# Patient Record
Sex: Male | Born: 1940
Health system: Southern US, Community
[De-identification: ages and names within clinical notes are randomized; demographics above are authoritative.]

## PROBLEM LIST (undated history)

## (undated) DIAGNOSIS — H9190 Unspecified hearing loss, unspecified ear: Secondary | ICD-10-CM

## (undated) DIAGNOSIS — I10 Essential (primary) hypertension: Secondary | ICD-10-CM

## (undated) DIAGNOSIS — Z923 Personal history of irradiation: Secondary | ICD-10-CM

## (undated) DIAGNOSIS — C61 Malignant neoplasm of prostate: Secondary | ICD-10-CM

## (undated) DIAGNOSIS — R519 Headache, unspecified: Secondary | ICD-10-CM

## (undated) DIAGNOSIS — M1990 Unspecified osteoarthritis, unspecified site: Secondary | ICD-10-CM

## (undated) DIAGNOSIS — T7840XA Allergy, unspecified, initial encounter: Secondary | ICD-10-CM

## (undated) DIAGNOSIS — E876 Hypokalemia: Secondary | ICD-10-CM

## (undated) HISTORY — PX: COLONOSCOPY: SHX174

## (undated) HISTORY — PX: OTHER SURGICAL HISTORY: SHX169

## (undated) HISTORY — DX: Headache, unspecified: R51.9

## (undated) HISTORY — DX: Essential (primary) hypertension: I10

## (undated) HISTORY — DX: Personal history of irradiation: Z92.3

---

## 2001-04-19 DIAGNOSIS — C61 Malignant neoplasm of prostate: Secondary | ICD-10-CM

## 2001-04-19 HISTORY — DX: Malignant neoplasm of prostate: C61

## 2001-04-19 HISTORY — PX: PROSTATE BIOPSY: SHX241

## 2001-06-21 ENCOUNTER — Encounter: Payer: Self-pay | Admitting: Urology

## 2001-06-28 ENCOUNTER — Inpatient Hospital Stay (HOSPITAL_COMMUNITY): Admission: RE | Admit: 2001-06-28 | Discharge: 2001-07-02 | Payer: Self-pay | Admitting: Urology

## 2004-01-02 ENCOUNTER — Ambulatory Visit (HOSPITAL_COMMUNITY): Admission: RE | Admit: 2004-01-02 | Discharge: 2004-01-02 | Payer: Self-pay | Admitting: Internal Medicine

## 2011-06-11 ENCOUNTER — Ambulatory Visit: Payer: Self-pay | Admitting: Orthopedic Surgery

## 2014-06-12 ENCOUNTER — Encounter: Payer: Self-pay | Admitting: Radiation Oncology

## 2014-06-12 ENCOUNTER — Telehealth: Payer: Self-pay | Admitting: *Deleted

## 2014-06-12 NOTE — Telephone Encounter (Signed)
Spoke w/patient re; where he had prostatectomy performed per Dr Leander Rams' note from [facility] dated 06/04/14. Patient states he had surgery "at [facility] 13 years ago next month".  Spoke w/medical records, [facility]; OP report and associated pathology to be faxed to this office.

## 2014-06-12 NOTE — Progress Notes (Signed)
GU Location of Tumor / Histology: Adenocarcinoma of the Prostate, Rising PSA Since 2002 Prostatectomy  If Prostate Cancer, Gleason Score is (3 + 3) and PSA is (0.63 on 06/04/14) 04/16/14 PSA 0.61 Prior PSA 0.3  Suanne Marker presented 2002 with signs/symptoms of: elevated PSA, positive prostate biopsy  Biopsies of prostate (if applicable) revealed:  03/29/3844 Prostatic adenocarcinoma Gleason 4+3=7, 5/5 nodes negative  Past/Anticipated interventions by urology, if any: Radical Prostatectomy 2002  Past/Anticipated interventions by medical oncology, if any: no  Weight changes, if any: none  Bowel/Bladder complaints, if any: None. "He is urinating well, has good urine flow.  No significant leakage. No pain"  Nocturia 2-3x night,   Nausea/Vomiting, if any: None  Pain issues, if any:  None,   SAFETY ISSUES:  Prior radiation? NO  Pacemaker/ICD?   Possible current pregnancy? na  Is the patient on methotrexate? no  Current Complaints / other details: married, 4 children, mother cancer in neck unknown what dx was maternal grandmother unknown cancer

## 2014-06-19 ENCOUNTER — Ambulatory Visit
Admission: RE | Admit: 2014-06-19 | Discharge: 2014-06-19 | Disposition: A | Discharge status: Home / Self Care | Payer: Medicare Other | Source: Ambulatory Visit | Attending: Radiation Oncology | Admitting: Radiation Oncology

## 2014-06-19 ENCOUNTER — Encounter: Payer: Self-pay | Admitting: Radiation Oncology

## 2014-06-19 VITALS — BP 139/71 | HR 79 | Temp 98.7°F | Resp 20 | Ht 69.5 in | Wt 212.4 lb

## 2014-06-19 DIAGNOSIS — Z7982 Long term (current) use of aspirin: Secondary | ICD-10-CM | POA: Diagnosis not present

## 2014-06-19 DIAGNOSIS — R5381 Other malaise: Secondary | ICD-10-CM | POA: Insufficient documentation

## 2014-06-19 DIAGNOSIS — N3040 Irradiation cystitis without hematuria: Secondary | ICD-10-CM | POA: Insufficient documentation

## 2014-06-19 DIAGNOSIS — M129 Arthropathy, unspecified: Secondary | ICD-10-CM | POA: Insufficient documentation

## 2014-06-19 DIAGNOSIS — C61 Malignant neoplasm of prostate: Secondary | ICD-10-CM

## 2014-06-19 DIAGNOSIS — Y9210 Unspecified residential institution as the place of occurrence of the external cause: Secondary | ICD-10-CM | POA: Diagnosis not present

## 2014-06-19 DIAGNOSIS — H9190 Unspecified hearing loss, unspecified ear: Secondary | ICD-10-CM | POA: Insufficient documentation

## 2014-06-19 DIAGNOSIS — Y842 Radiological procedure and radiotherapy as the cause of abnormal reaction of the patient, or of later complication, without mention of misadventure at the time of the procedure: Secondary | ICD-10-CM | POA: Diagnosis not present

## 2014-06-19 DIAGNOSIS — Z510 Encounter for antineoplastic radiation therapy: Secondary | ICD-10-CM | POA: Insufficient documentation

## 2014-06-19 DIAGNOSIS — R5383 Other fatigue: Secondary | ICD-10-CM

## 2014-06-19 DIAGNOSIS — R351 Nocturia: Secondary | ICD-10-CM | POA: Insufficient documentation

## 2014-06-19 HISTORY — DX: Allergy, unspecified, initial encounter: T78.40XA

## 2014-06-19 HISTORY — DX: Unspecified osteoarthritis, unspecified site: M19.90

## 2014-06-19 HISTORY — DX: Malignant neoplasm of prostate: C61

## 2014-06-19 HISTORY — DX: Unspecified hearing loss, unspecified ear: H91.90

## 2014-06-19 NOTE — Progress Notes (Signed)
Please see the Nurse Progress Note in the MD Initial Consult Encounter for this patient. 

## 2014-06-19 NOTE — Progress Notes (Signed)
[facility] NEW PATIENT EVALUATION  Name: William Gallagher MRN: [id number]  Date:   06/19/2014           DOB: 17-Aug-1941  Status: outpatient   CC: Dr. Asencion Noble,   Myrlene Broker, MD    REFERRING PHYSICIAN: Myrlene Broker, MD   DIAGNOSIS: PSA recurrent carcinoma the prostate   HISTORY OF PRESENT ILLNESS:  William Gallagher is a 73 y.o. male who is seen today through the courtesy of Dr. Lawerance Bach for consideration of salvage radiation therapy in the management of his PSA recurrent carcinoma the prostate. In 2002 he presented with an elevated PSA of 5.32 with ultrasound guided biopsies the prostate revealing Gleason 6 adenocarcinoma from several areas including the right lateral base, right mid prostate in the right lateral apex. He elected for a radical retropubic prostatectomy which was performed by Dr. Lawerance Bach on 06/28/2001. On review of his pathology he had Gleason 7 (4+3) adenocarcinoma involving both lobes with focal margin involvement on the right side. 5 lymph nodes were free of metastatic disease. Seminal vesicles were free of tumor. He did well postoperatively, and more recently he was followed by Dr. Willey Blade who obtained a PSA approximately 6 months ago and this was 0.3. A followup PSA on 04/16/2014 was 0.61. He was seen by Dr. Lawerance Bach who has him referred here for treatment closer to home. He is doing well from a GU and GI standpoint. He has no significant leakage. He does have erectile dysfunction.  PREVIOUS RADIATION THERAPY: No   PAST MEDICAL HISTORY:  has a past medical history of Prostate cancer (04/19/2001); Allergy; Arthritis; and HOH (hard of hearing).     PAST SURGICAL HISTORY:  Past Surgical History  Procedure Laterality Date  . Prostate biopsy  04/19/2001    adenocarcinoma  . Cataract surgery Bilateral     4-5 years ago     FAMILY HISTORY: family history includes Cancer in his mother and paternal uncle; Kidney failure in his  father. His father died of kidney failure in his 38s. His mother died from metastatic cancer, unknown etiology, in her 31s. No family history of prostate cancer.   SOCIAL HISTORY:  reports that he has never smoked. He has never used smokeless tobacco. He reports that he does not drink alcohol or use illicit drugs. Married, 4 children. 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   ALLERGIES: Tape   MEDICATIONS:  Current Outpatient Prescriptions  Medication Sig Dispense Refill  . amLODipine (NORVASC) 10 MG tablet Take 10 mg by mouth daily.       Marland Kitchen aspirin 81 MG tablet Take 81 mg by mouth daily.       Marland Kitchen losartan (COZAAR) 100 MG tablet Take 100 mg by mouth daily.        No current facility-administered medications for this encounter.     REVIEW OF SYSTEMS:  Pertinent items are noted in HPI.    PHYSICAL EXAM:  height is 5' 9.5" (1.765 m) and weight is 212 lb 6.4 oz (96.344 kg). His oral temperature is 98.7 F (37.1 C). His blood pressure is 139/71 and his pulse is 79. His respiration is 20.   Alert and oriented 73 year old white male appearing younger than his stated age. Head and neck examination: Grossly unremarkable. Nodes: Without palpable cervical or supraclavicular lymphadenopathy. Chest: Lungs clear. Back: Without spinal or CVA tenderness. Abdomen: Without masses organomegaly. Genitalia: Unremarkable to inspection. Rectal: The  prostate bed is flat and is without masses or nodularity. Extremities: Without edema.   LABORATORY DATA:  No results found for this basename: WBC, HGB, HCT, MCV, PLT   No results found for this basename: NA, K, CL, CO2   No results found for this basename: ALT, AST, GGT, ALKPHOS, BILITOT   PSA 0.63 from 06/04/2014   IMPRESSION: PSA recurrent carcinoma the prostate. I explained to the patient that he has your a local failure alone, distant failure alone, or both local and distant failure. Predictors for local failure alone include a positive surgical margin  and a long disease-free interval. These 2 prognostic factors predict for a local failure alone and reasonable chance for salvage/cure with radiation therapy to his prostate bed. We discussed the potential acute and late toxicities of radiation therapy. We also discussed treated with a comfortably full bladder to minimize urinary toxicity. Consent is signed today.   PLAN: As discussed above. He will return for simulation/treatment planning early next week.  I spent 45  minutes face to face with the patient and more than 50% of that time was spent in counseling and/or coordination of care.

## 2014-06-25 ENCOUNTER — Ambulatory Visit
Admission: RE | Admit: 2014-06-25 | Discharge: 2014-06-25 | Disposition: A | Discharge status: Home / Self Care | Payer: Medicare Other | Source: Ambulatory Visit | Attending: Radiation Oncology | Admitting: Radiation Oncology

## 2014-06-25 DIAGNOSIS — C61 Malignant neoplasm of prostate: Secondary | ICD-10-CM

## 2014-06-25 DIAGNOSIS — Z510 Encounter for antineoplastic radiation therapy: Secondary | ICD-10-CM | POA: Diagnosis not present

## 2014-06-25 NOTE — Progress Notes (Signed)
Complex simulation/treatment planning note: The patient was taken to the CT simulator. A red rubber catheter was placed within the rectal vault. He was then catheterized and contrast instilled into the urethra/bladder. He was then scanned. The CT data set was sent to the MIM planning system where I contoured his prostate bed (CTV 66) and expanded this by 0.5 cm to create PTV 66. I also contoured his rectum, bladder, and lower rectosigmoid colon. I prescribing 6600 cGy to his PTV 66 and 33 sessions. I am requesting a comfortably full bladder. He will undergo daily MVCT, setting up to his anterior rectum. He is now ready for IMRT simulation/treatment planning.

## 2014-06-28 ENCOUNTER — Encounter: Payer: Self-pay | Admitting: Radiation Oncology

## 2014-06-28 DIAGNOSIS — Z510 Encounter for antineoplastic radiation therapy: Secondary | ICD-10-CM | POA: Diagnosis not present

## 2014-06-28 NOTE — Progress Notes (Signed)
IMRT simulation/treatment planning note: The patient underwent IMRT simulation/treatment planning to decrease the risk for both acute and late bladder and rectal toxicity compared to conventional or 3-D conformal radiation therapy. Dose volume histograms were obtained for the target structures and also avoidance structures including the bladder and rectum. We met our departmental guidelines. I'm prescribing 6600 cGy in 33 sessions to his prostate bed. He is being treated with 6 MV photons, helical IMRT Tomotherapy . I requesting daily MV CT, setting up to his anterior rectum. I'm also requesting a comfortably full bladder to minimize urinary toxicity. 

## 2014-07-01 DIAGNOSIS — Z510 Encounter for antineoplastic radiation therapy: Secondary | ICD-10-CM | POA: Diagnosis not present

## 2014-07-04 ENCOUNTER — Ambulatory Visit
Admission: RE | Admit: 2014-07-04 | Discharge: 2014-07-04 | Disposition: A | Discharge status: Home / Self Care | Payer: Medicare Other | Source: Ambulatory Visit | Attending: Radiation Oncology | Admitting: Radiation Oncology

## 2014-07-04 DIAGNOSIS — C61 Malignant neoplasm of prostate: Secondary | ICD-10-CM

## 2014-07-04 DIAGNOSIS — Z510 Encounter for antineoplastic radiation therapy: Secondary | ICD-10-CM | POA: Diagnosis not present

## 2014-07-04 NOTE — Progress Notes (Signed)
Patient education completed with patient. Gave pt "Radiation and You" booklet w/all pertinent information marked and discussed, re: rectal discomfort/care, fatigue, hair loss, urinary/bladder irritation/management, nutrition, pain. Pt verbalized understanding.

## 2014-07-05 ENCOUNTER — Ambulatory Visit
Admission: RE | Admit: 2014-07-05 | Discharge: 2014-07-05 | Disposition: A | Discharge status: Home / Self Care | Payer: Medicare Other | Source: Ambulatory Visit | Attending: Radiation Oncology | Admitting: Radiation Oncology

## 2014-07-05 DIAGNOSIS — Z510 Encounter for antineoplastic radiation therapy: Secondary | ICD-10-CM | POA: Diagnosis not present

## 2014-07-05 NOTE — Progress Notes (Signed)
Patient in nursing after treatment today stating his bowels have not moved in 3 days. He states he took a laxative last night with no results. Advised he take stool softeners 2 tabs nightly x 2-3 nights. Advised if he does not have BM in 1- 2 days to use enema. Also advised he increase water intake, decrease tea intake. Patient verbalized understanding , agreement.

## 2014-07-06 ENCOUNTER — Ambulatory Visit
Admission: RE | Admit: 2014-07-06 | Discharge: 2014-07-06 | Disposition: A | Discharge status: Home / Self Care | Payer: Medicare Other | Source: Ambulatory Visit | Attending: Radiation Oncology | Admitting: Radiation Oncology

## 2014-07-06 DIAGNOSIS — Z510 Encounter for antineoplastic radiation therapy: Secondary | ICD-10-CM | POA: Diagnosis not present

## 2014-07-08 ENCOUNTER — Encounter: Payer: Self-pay | Admitting: Radiation Oncology

## 2014-07-08 NOTE — Progress Notes (Signed)
Chart note: The patient underwent Tomotherapy segmentation for treatment to his prostate bed on 07/04/2014. He was treated with 5.3 delivered field widths corresponding to one set of IMRT treatment devices [phone]).

## 2014-07-09 ENCOUNTER — Ambulatory Visit
Admission: RE | Admit: 2014-07-09 | Discharge: 2014-07-09 | Disposition: A | Discharge status: Home / Self Care | Payer: Medicare Other | Source: Ambulatory Visit | Attending: Radiation Oncology | Admitting: Radiation Oncology

## 2014-07-09 ENCOUNTER — Encounter: Payer: Self-pay | Admitting: Radiation Oncology

## 2014-07-09 VITALS — BP 149/81 | HR 66 | Temp 98.1°F | Resp 20 | Wt 211.9 lb

## 2014-07-09 DIAGNOSIS — C61 Malignant neoplasm of prostate: Secondary | ICD-10-CM

## 2014-07-09 DIAGNOSIS — Z510 Encounter for antineoplastic radiation therapy: Secondary | ICD-10-CM | POA: Diagnosis not present

## 2014-07-09 NOTE — Progress Notes (Signed)
Weekly Management Note:  Site: Prostate bed Current Dose:  800  cGy Projected Dose: 6600  cGy  Narrative: The patient is seen today for routine under treatment assessment. CBCT/MVCT images/port films were reviewed. The chart was reviewed.   Bladder filling is satisfactory. No GU or GI difficulty.  Physical Examination:  Filed Vitals:   07/09/14 1023  BP: 149/81  Pulse: 66  Temp: 98.1 F (36.7 C)  Resp: 20  .  Weight: 211 lb 14.4 oz (96.117 kg). No change.  Impression: Tolerating radiation therapy well.  Plan: Continue radiation therapy as planned.

## 2014-07-09 NOTE — Progress Notes (Signed)
Patient denies pain, urinary/bowel issues, fatigue, loss of appetite. He states his bowels moved over the weekend.

## 2014-07-10 ENCOUNTER — Ambulatory Visit
Admission: RE | Admit: 2014-07-10 | Discharge: 2014-07-10 | Disposition: A | Discharge status: Home / Self Care | Payer: Medicare Other | Source: Ambulatory Visit | Attending: Radiation Oncology | Admitting: Radiation Oncology

## 2014-07-10 DIAGNOSIS — Z510 Encounter for antineoplastic radiation therapy: Secondary | ICD-10-CM | POA: Diagnosis not present

## 2014-07-11 ENCOUNTER — Ambulatory Visit: Payer: Medicare Other

## 2014-07-12 ENCOUNTER — Ambulatory Visit
Admission: RE | Admit: 2014-07-12 | Discharge: 2014-07-12 | Disposition: A | Discharge status: Home / Self Care | Payer: Medicare Other | Source: Ambulatory Visit | Attending: Radiation Oncology | Admitting: Radiation Oncology

## 2014-07-12 DIAGNOSIS — Z510 Encounter for antineoplastic radiation therapy: Secondary | ICD-10-CM | POA: Diagnosis not present

## 2014-07-13 ENCOUNTER — Ambulatory Visit
Admission: RE | Admit: 2014-07-13 | Discharge: 2014-07-13 | Disposition: A | Discharge status: Home / Self Care | Payer: Medicare Other | Source: Ambulatory Visit | Attending: Radiation Oncology | Admitting: Radiation Oncology

## 2014-07-13 DIAGNOSIS — Z510 Encounter for antineoplastic radiation therapy: Secondary | ICD-10-CM | POA: Diagnosis not present

## 2014-07-16 ENCOUNTER — Ambulatory Visit
Admission: RE | Admit: 2014-07-16 | Discharge: 2014-07-16 | Disposition: A | Discharge status: Home / Self Care | Payer: Medicare Other | Source: Ambulatory Visit | Attending: Radiation Oncology | Admitting: Radiation Oncology

## 2014-07-16 ENCOUNTER — Encounter: Payer: Self-pay | Admitting: Radiation Oncology

## 2014-07-16 VITALS — BP 150/83 | HR 56 | Temp 98.3°F | Resp 20 | Wt 210.3 lb

## 2014-07-16 DIAGNOSIS — C61 Malignant neoplasm of prostate: Secondary | ICD-10-CM

## 2014-07-16 DIAGNOSIS — Z510 Encounter for antineoplastic radiation therapy: Secondary | ICD-10-CM | POA: Diagnosis not present

## 2014-07-16 NOTE — Progress Notes (Signed)
Weekly Management Note:  Site: Prostate bed Current Dose:  1600  cGy Projected Dose: 6600  cGy  Narrative: The patient is seen today for routine under treatment assessment. CBCT/MVCT images/port films were reviewed. The chart was reviewed.   Bladder filling appears to be satisfactory. No GU or GI difficulty. His nocturia x3-4 is unchanged.  Physical Examination:  Filed Vitals:   07/16/14 0940  BP: 150/83  Pulse: 56  Temp: 98.3 F (36.8 C)  Resp: 20  .  Weight: 210 lb 4.8 oz (95.391 kg). No change.  Impression: Tolerating radiation therapy well.  Plan: Continue radiation therapy as planned.

## 2014-07-16 NOTE — Progress Notes (Signed)
Weekly PUT Progress Note - Prostate  Bladder   denies issues  Bowel denies issues  Medications taken: na  Pain Pain Assessment Pain Score: 0-No pain  Fatigue No  Loss of appetite No

## 2014-07-17 ENCOUNTER — Ambulatory Visit
Admission: RE | Admit: 2014-07-17 | Discharge: 2014-07-17 | Disposition: A | Discharge status: Home / Self Care | Payer: Medicare Other | Source: Ambulatory Visit | Attending: Radiation Oncology | Admitting: Radiation Oncology

## 2014-07-17 DIAGNOSIS — Z510 Encounter for antineoplastic radiation therapy: Secondary | ICD-10-CM | POA: Diagnosis not present

## 2014-07-18 ENCOUNTER — Ambulatory Visit
Admission: RE | Admit: 2014-07-18 | Discharge: 2014-07-18 | Disposition: A | Discharge status: Home / Self Care | Payer: Medicare Other | Source: Ambulatory Visit | Attending: Radiation Oncology | Admitting: Radiation Oncology

## 2014-07-18 DIAGNOSIS — Z510 Encounter for antineoplastic radiation therapy: Secondary | ICD-10-CM | POA: Diagnosis not present

## 2014-07-19 ENCOUNTER — Ambulatory Visit: Admission: RE | Admit: 2014-07-19 | Payer: Medicare Other | Source: Ambulatory Visit

## 2014-07-20 ENCOUNTER — Ambulatory Visit
Admission: RE | Admit: 2014-07-20 | Discharge: 2014-07-20 | Disposition: A | Discharge status: Home / Self Care | Payer: Medicare Other | Source: Ambulatory Visit | Attending: Radiation Oncology | Admitting: Radiation Oncology

## 2014-07-20 DIAGNOSIS — Z510 Encounter for antineoplastic radiation therapy: Secondary | ICD-10-CM | POA: Diagnosis not present

## 2014-07-23 ENCOUNTER — Ambulatory Visit
Admission: RE | Admit: 2014-07-23 | Discharge: 2014-07-23 | Disposition: A | Discharge status: Home / Self Care | Payer: Medicare Other | Source: Ambulatory Visit | Attending: Radiation Oncology | Admitting: Radiation Oncology

## 2014-07-23 ENCOUNTER — Encounter: Payer: Self-pay | Admitting: Radiation Oncology

## 2014-07-23 VITALS — BP 157/76 | HR 65 | Temp 98.1°F | Resp 20 | Wt 210.2 lb

## 2014-07-23 DIAGNOSIS — C61 Malignant neoplasm of prostate: Secondary | ICD-10-CM

## 2014-07-23 DIAGNOSIS — Z510 Encounter for antineoplastic radiation therapy: Secondary | ICD-10-CM | POA: Diagnosis not present

## 2014-07-23 NOTE — Progress Notes (Signed)
Patient denies pain, loss of appetite. He states he "may be a little more tired than usual". He denies urinary, bowel issues. He has nocturia x 3-4 which was his baseline prior to beginning radiation treatments.

## 2014-07-23 NOTE — Progress Notes (Signed)
   Weekly Management Note:  outpatient    ICD-9-CM  1. Malignant neoplasm of prostate 185    Current Dose:  24 Gy  Projected Dose: 66 Gy   Narrative:  The patient presents for routine under treatment assessment.  CBCT/MVCT images/Port film x-rays were reviewed.  The chart was checked. No new complaints except some fatigue  Physical Findings:  weight is 210 lb 3.2 oz (95.346 kg). His oral temperature is 98.1 F (36.7 C). His blood pressure is 157/76 and his pulse is 65. His respiration is 20.  NAD  Impression:  The patient is tolerating radiotherapy.  Plan:  Continue radiotherapy as planned.   ________________________________   Eppie Gibson, M.D.

## 2014-07-24 ENCOUNTER — Ambulatory Visit
Admission: RE | Admit: 2014-07-24 | Discharge: 2014-07-24 | Disposition: A | Discharge status: Home / Self Care | Payer: Medicare Other | Source: Ambulatory Visit | Attending: Radiation Oncology | Admitting: Radiation Oncology

## 2014-07-24 DIAGNOSIS — Z510 Encounter for antineoplastic radiation therapy: Secondary | ICD-10-CM | POA: Diagnosis not present

## 2014-07-25 ENCOUNTER — Ambulatory Visit
Admission: RE | Admit: 2014-07-25 | Discharge: 2014-07-25 | Disposition: A | Discharge status: Home / Self Care | Payer: Medicare Other | Source: Ambulatory Visit | Attending: Radiation Oncology | Admitting: Radiation Oncology

## 2014-07-25 DIAGNOSIS — Z510 Encounter for antineoplastic radiation therapy: Secondary | ICD-10-CM | POA: Diagnosis not present

## 2014-07-26 ENCOUNTER — Ambulatory Visit
Admission: RE | Admit: 2014-07-26 | Discharge: 2014-07-26 | Disposition: A | Discharge status: Home / Self Care | Payer: Medicare Other | Source: Ambulatory Visit | Attending: Radiation Oncology | Admitting: Radiation Oncology

## 2014-07-26 DIAGNOSIS — Z510 Encounter for antineoplastic radiation therapy: Secondary | ICD-10-CM | POA: Diagnosis not present

## 2014-07-27 ENCOUNTER — Ambulatory Visit
Admission: RE | Admit: 2014-07-27 | Discharge: 2014-07-27 | Disposition: A | Discharge status: Home / Self Care | Payer: Medicare Other | Source: Ambulatory Visit | Attending: Radiation Oncology | Admitting: Radiation Oncology

## 2014-07-27 DIAGNOSIS — Z510 Encounter for antineoplastic radiation therapy: Secondary | ICD-10-CM | POA: Diagnosis not present

## 2014-07-30 ENCOUNTER — Ambulatory Visit
Admission: RE | Admit: 2014-07-30 | Discharge: 2014-07-30 | Disposition: A | Discharge status: Home / Self Care | Payer: Medicare Other | Source: Ambulatory Visit | Attending: Radiation Oncology | Admitting: Radiation Oncology

## 2014-07-30 VITALS — BP 150/87 | HR 57 | Temp 97.9°F | Resp 12 | Wt 210.8 lb

## 2014-07-30 DIAGNOSIS — Z510 Encounter for antineoplastic radiation therapy: Secondary | ICD-10-CM | POA: Diagnosis not present

## 2014-07-30 DIAGNOSIS — C61 Malignant neoplasm of prostate: Secondary | ICD-10-CM

## 2014-07-30 NOTE — Progress Notes (Signed)
Weekly Management Note:  Site: Prostate bed Current Dose:  3400  cGy Projected Dose: 6600  cGy  Narrative: The patient is seen today for routine under treatment assessment. CBCT/MVCT images/port films were reviewed. The chart was reviewed.   He seen today prior to his treatment. I again encouraged him to keep a comfortably full bladder. I will review his bladder filling later today. He does have nocturia x3-4 but is otherwise doing well from a GU and GI standpoint.  Physical Examination:  Filed Vitals:   07/30/14 0939  BP: 150/87  Pulse: 57  Temp: 97.9 F (36.6 C)  Resp: 12  .  Weight: 210 lb 12.8 oz (95.618 kg). No change.  Impression: Tolerating radiation therapy well.  Plan: Continue radiation therapy as planned.

## 2014-07-30 NOTE — Progress Notes (Signed)
He is currently in no pain. No complaints. Pt states they urinate 3 - 4 times per night.  Pt reports, 1  bowel movement(s) per day with a loose consistency.  The patient eats a regular, healthy diet.

## 2014-07-31 ENCOUNTER — Ambulatory Visit
Admission: RE | Admit: 2014-07-31 | Discharge: 2014-07-31 | Disposition: A | Discharge status: Home / Self Care | Payer: Medicare Other | Source: Ambulatory Visit | Attending: Radiation Oncology | Admitting: Radiation Oncology

## 2014-07-31 DIAGNOSIS — Z510 Encounter for antineoplastic radiation therapy: Secondary | ICD-10-CM | POA: Diagnosis not present

## 2014-08-01 ENCOUNTER — Ambulatory Visit
Admission: RE | Admit: 2014-08-01 | Discharge: 2014-08-01 | Disposition: A | Discharge status: Home / Self Care | Payer: Medicare Other | Source: Ambulatory Visit | Attending: Radiation Oncology | Admitting: Radiation Oncology

## 2014-08-01 DIAGNOSIS — Z510 Encounter for antineoplastic radiation therapy: Secondary | ICD-10-CM | POA: Diagnosis not present

## 2014-08-02 ENCOUNTER — Ambulatory Visit
Admission: RE | Admit: 2014-08-02 | Discharge: 2014-08-02 | Disposition: A | Discharge status: Home / Self Care | Payer: Medicare Other | Source: Ambulatory Visit | Attending: Radiation Oncology | Admitting: Radiation Oncology

## 2014-08-02 DIAGNOSIS — Z510 Encounter for antineoplastic radiation therapy: Secondary | ICD-10-CM | POA: Diagnosis not present

## 2014-08-03 ENCOUNTER — Ambulatory Visit
Admission: RE | Admit: 2014-08-03 | Discharge: 2014-08-03 | Disposition: A | Discharge status: Home / Self Care | Payer: Medicare Other | Source: Ambulatory Visit | Attending: Radiation Oncology | Admitting: Radiation Oncology

## 2014-08-03 DIAGNOSIS — Z510 Encounter for antineoplastic radiation therapy: Secondary | ICD-10-CM | POA: Diagnosis not present

## 2014-08-06 ENCOUNTER — Ambulatory Visit
Admission: RE | Admit: 2014-08-06 | Discharge: 2014-08-06 | Disposition: A | Discharge status: Home / Self Care | Payer: Medicare Other | Source: Ambulatory Visit | Attending: Radiation Oncology | Admitting: Radiation Oncology

## 2014-08-06 ENCOUNTER — Encounter: Payer: Self-pay | Admitting: Radiation Oncology

## 2014-08-06 VITALS — BP 151/82 | HR 58 | Temp 98.1°F | Resp 20 | Wt 211.9 lb

## 2014-08-06 DIAGNOSIS — Z510 Encounter for antineoplastic radiation therapy: Secondary | ICD-10-CM | POA: Diagnosis not present

## 2014-08-06 DIAGNOSIS — C61 Malignant neoplasm of prostate: Secondary | ICD-10-CM

## 2014-08-06 NOTE — Progress Notes (Signed)
Weekly Management Note:  Site: Prostate bed Current Dose:  4400  cGy Projected Dose: 6600  cGy  Narrative: The patient is seen today for routine under treatment assessment. CBCT/MVCT images/port films were reviewed. The chart was reviewed.   Bladder filling appears to be excellent. No GU or GI difficulty.  Physical Examination:  Filed Vitals:   08/06/14 0925  BP: 151/82  Pulse: 58  Temp: 98.1 F (36.7 C)  Resp: 20  .  Weight: 211 lb 14.4 oz (96.117 kg). No change.  Impression: Tolerating radiation therapy well.  Plan: Continue radiation therapy as planned.

## 2014-08-06 NOTE — Progress Notes (Signed)
Patient denies pain, bowel issues. He states he continues to have nocturia x 3-4 which is his baseline. He states he had incontinence of bowels last Friday, unsure if it was related to a food he ate. He states he no longer has that problem. He denies fatigue, loss of appetite.

## 2014-08-07 ENCOUNTER — Ambulatory Visit
Admission: RE | Admit: 2014-08-07 | Discharge: 2014-08-07 | Disposition: A | Discharge status: Home / Self Care | Payer: Medicare Other | Source: Ambulatory Visit | Attending: Radiation Oncology | Admitting: Radiation Oncology

## 2014-08-07 DIAGNOSIS — Z510 Encounter for antineoplastic radiation therapy: Secondary | ICD-10-CM | POA: Diagnosis not present

## 2014-08-08 ENCOUNTER — Ambulatory Visit
Admission: RE | Admit: 2014-08-08 | Discharge: 2014-08-08 | Disposition: A | Discharge status: Home / Self Care | Payer: Medicare Other | Source: Ambulatory Visit | Attending: Radiation Oncology | Admitting: Radiation Oncology

## 2014-08-08 DIAGNOSIS — Z510 Encounter for antineoplastic radiation therapy: Secondary | ICD-10-CM | POA: Diagnosis not present

## 2014-08-09 ENCOUNTER — Ambulatory Visit
Admission: RE | Admit: 2014-08-09 | Discharge: 2014-08-09 | Disposition: A | Discharge status: Home / Self Care | Payer: Medicare Other | Source: Ambulatory Visit | Attending: Radiation Oncology | Admitting: Radiation Oncology

## 2014-08-09 DIAGNOSIS — Z510 Encounter for antineoplastic radiation therapy: Secondary | ICD-10-CM | POA: Diagnosis not present

## 2014-08-10 ENCOUNTER — Ambulatory Visit
Admission: RE | Admit: 2014-08-10 | Discharge: 2014-08-10 | Disposition: A | Discharge status: Home / Self Care | Payer: Medicare Other | Source: Ambulatory Visit | Attending: Radiation Oncology | Admitting: Radiation Oncology

## 2014-08-10 DIAGNOSIS — Z510 Encounter for antineoplastic radiation therapy: Secondary | ICD-10-CM | POA: Diagnosis not present

## 2014-08-13 ENCOUNTER — Encounter: Payer: Self-pay | Admitting: Radiation Oncology

## 2014-08-13 ENCOUNTER — Ambulatory Visit
Admission: RE | Admit: 2014-08-13 | Discharge: 2014-08-13 | Disposition: A | Discharge status: Home / Self Care | Payer: Medicare Other | Source: Ambulatory Visit | Attending: Radiation Oncology | Admitting: Radiation Oncology

## 2014-08-13 VITALS — BP 130/76 | HR 65 | Temp 98.6°F | Resp 20 | Wt 212.7 lb

## 2014-08-13 DIAGNOSIS — Z510 Encounter for antineoplastic radiation therapy: Secondary | ICD-10-CM | POA: Diagnosis not present

## 2014-08-13 DIAGNOSIS — C61 Malignant neoplasm of prostate: Secondary | ICD-10-CM

## 2014-08-13 NOTE — Progress Notes (Signed)
Patient denies pain, bladder/bowel issues, loss of appetite. He is fatigued.

## 2014-08-13 NOTE — Progress Notes (Signed)
Weekly Management Note:   Site: Prostate bed Current Dose:  5400  cGy Projected Dose:  6600  cGy  Narrative: The patient is seen today for routine under treatment assessment. CBCT/MVCT images/port films were reviewed. The chart was reviewed.   Bladder filling appears to be satisfactory. No significant GU or GI difficulties. He'll finish his treatment next week  Physical Examination:  Filed Vitals:   08/13/14 0916  BP: 130/76  Pulse: 65  Temp: 98.6 F (37 C)  Resp: 20  .  Weight: 212 lb 11.2 oz (96.48 kg). No change.  Impression: Tolerating radiation therapy well.  Plan: Continue radiation therapy as planned.

## 2014-08-14 ENCOUNTER — Ambulatory Visit
Admission: RE | Admit: 2014-08-14 | Discharge: 2014-08-14 | Disposition: A | Discharge status: Home / Self Care | Payer: Medicare Other | Source: Ambulatory Visit | Attending: Radiation Oncology | Admitting: Radiation Oncology

## 2014-08-14 DIAGNOSIS — Z510 Encounter for antineoplastic radiation therapy: Secondary | ICD-10-CM | POA: Diagnosis not present

## 2014-08-15 ENCOUNTER — Ambulatory Visit
Admission: RE | Admit: 2014-08-15 | Discharge: 2014-08-15 | Disposition: A | Discharge status: Home / Self Care | Payer: Medicare Other | Source: Ambulatory Visit | Attending: Radiation Oncology | Admitting: Radiation Oncology

## 2014-08-15 DIAGNOSIS — Z510 Encounter for antineoplastic radiation therapy: Secondary | ICD-10-CM | POA: Diagnosis not present

## 2014-08-16 ENCOUNTER — Ambulatory Visit
Admission: RE | Admit: 2014-08-16 | Discharge: 2014-08-16 | Disposition: A | Discharge status: Home / Self Care | Payer: Medicare Other | Source: Ambulatory Visit | Attending: Radiation Oncology | Admitting: Radiation Oncology

## 2014-08-16 DIAGNOSIS — Z510 Encounter for antineoplastic radiation therapy: Secondary | ICD-10-CM | POA: Diagnosis not present

## 2014-08-17 ENCOUNTER — Ambulatory Visit
Admission: RE | Admit: 2014-08-17 | Discharge: 2014-08-17 | Disposition: A | Discharge status: Home / Self Care | Payer: Medicare Other | Source: Ambulatory Visit | Attending: Radiation Oncology | Admitting: Radiation Oncology

## 2014-08-17 ENCOUNTER — Ambulatory Visit: Payer: Medicare Other

## 2014-08-17 DIAGNOSIS — Z510 Encounter for antineoplastic radiation therapy: Secondary | ICD-10-CM | POA: Diagnosis not present

## 2014-08-21 ENCOUNTER — Ambulatory Visit: Payer: Medicare Other

## 2014-08-21 ENCOUNTER — Ambulatory Visit
Admission: RE | Admit: 2014-08-21 | Discharge: 2014-08-21 | Disposition: A | Discharge status: Home / Self Care | Payer: Medicare Other | Source: Ambulatory Visit | Attending: Radiation Oncology | Admitting: Radiation Oncology

## 2014-08-21 ENCOUNTER — Encounter: Payer: Self-pay | Admitting: Radiation Oncology

## 2014-08-21 VITALS — BP 140/70 | HR 57 | Temp 98.3°F | Resp 20 | Wt 212.1 lb

## 2014-08-21 DIAGNOSIS — C61 Malignant neoplasm of prostate: Secondary | ICD-10-CM

## 2014-08-21 DIAGNOSIS — Z510 Encounter for antineoplastic radiation therapy: Secondary | ICD-10-CM | POA: Diagnosis not present

## 2014-08-21 NOTE — Progress Notes (Signed)
Weekly Management Note:  Site: Prostate bed Current Dose:  6400  cGy Projected Dose: 6600  cGy  Narrative: The patient is seen today for routine under treatment assessment. CBCT/MVCT images/port films were reviewed. The chart was reviewed.   Bladder filling is satisfactory. He is having nocturia every 2 hours. No dysuria. No GI difficulties.  Physical Examination:  Filed Vitals:   08/21/14 0924  BP: 140/70  Pulse: 57  Temp: 98.3 F (36.8 C)  Resp: 20  .  Weight: 212 lb 1.6 oz (96.208 kg). No change.  Impression: Tolerating radiation therapy well. He does have mild radiation urethritis/cystitis as expected.  Plan: Continue radiation therapy as planned. He'll finish his radiation therapy tomorrow and see me for a one-month followup visit.

## 2014-08-21 NOTE — Progress Notes (Signed)
Patient denies pain, loss of appetite, bowel issues. He states he has nocturia every 2 hours now, some fatigue. Pt will complete tomorrow, gave him 1 month FU card.

## 2014-08-22 ENCOUNTER — Encounter: Payer: Self-pay | Admitting: Radiation Oncology

## 2014-08-22 ENCOUNTER — Ambulatory Visit
Admission: RE | Admit: 2014-08-22 | Discharge: 2014-08-22 | Disposition: A | Discharge status: Home / Self Care | Payer: Medicare Other | Source: Ambulatory Visit | Attending: Radiation Oncology | Admitting: Radiation Oncology

## 2014-08-22 ENCOUNTER — Ambulatory Visit: Payer: Medicare Other

## 2014-08-22 DIAGNOSIS — Z510 Encounter for antineoplastic radiation therapy: Secondary | ICD-10-CM | POA: Diagnosis not present

## 2014-08-22 NOTE — Progress Notes (Signed)
[facility] End of Treatment Note  Name:William Gallagher  Date: 08/22/2014 MYT:[id number] DOB:[birth year]   Status:outpatient    CC: Dr. Asencion Noble, Dr. Tresa Endo  REFERRING PHYSICIAN: Dr. Tresa Endo   DIAGNOSIS:  PSA recurrent carcinoma the prostate  INDICATION FOR TREATMENT: Curative   TREATMENT DATES: 07/04/2014 through 08/22/2014                          SITE/DOSE: Prostate bed 6600 cGy in 33 sessions                           BEAMS/ENERGY:   6 MV photons, helical IMRT Tomotherapy                NARRATIVE:   [name] tolerated his treatment well with no significant GU or GI toxicity during his course of therapy                         PLAN: Routine followup in one month. Patient instructed to call if questions or worsening complaints in interim.

## 2014-09-20 ENCOUNTER — Encounter: Payer: Self-pay | Admitting: *Deleted

## 2014-09-25 ENCOUNTER — Ambulatory Visit
Admission: RE | Admit: 2014-09-25 | Discharge: 2014-09-25 | Disposition: A | Discharge status: Home / Self Care | Payer: Medicare Other | Source: Ambulatory Visit | Attending: Radiation Oncology | Admitting: Radiation Oncology

## 2014-09-25 ENCOUNTER — Encounter: Payer: Self-pay | Admitting: Radiation Oncology

## 2014-09-25 VITALS — BP 140/77 | HR 63 | Temp 97.9°F | Resp 20 | Wt 212.6 lb

## 2014-09-25 DIAGNOSIS — C61 Malignant neoplasm of prostate: Secondary | ICD-10-CM

## 2014-09-25 NOTE — Progress Notes (Signed)
Patient denies pain, fatigue, loss of appetite, bowel issues. He states he continues to have nocturia x 2-3 depending on how much he drinks at night. He states this is an improvement because he was getting up 4-5 x at night. He saw Dr Rosana Hoes yesterday, no lab work done.

## 2014-09-25 NOTE — Progress Notes (Signed)
CC: Dr. Asencion Noble, Dr. Tresa Endo III  Followup note:  William Gallagher returns today approximately 1 month following completion of salvage radiation therapy in the management of his PSA recurrent carcinoma the prostate. He is without GU or GI difficulties. He does have nocturia x 2-3 which is better than tthe baseline that he had prior to radiation therapy. He tells me he saw Dr. Rosana Hoes last week but he did not have my end of treatment summary. He will see Dr. Rosana Hoes for a followup visit in February 2016 at which time he will have a PSA determination. He sees Dr. Willey Blade for routine followup in November.  Physical examination: Alert and oriented. Filed Vitals:   09/25/14 0958  BP: 140/77  Pulse: 63  Temp: 97.9 F (36.6 C)  Resp: 20   Rectal examination not performed today.  Impression: Satisfactory progress. No residual acute radiation toxicities.  Plan: Follow this with Dr. Rosana Hoes in February 2016. I've not scheduled William Gallagher for a formal followup visit and I ask that Dr. Rosana Hoes keep me posted on his progress.

## 2015-02-18 DIAGNOSIS — C61 Malignant neoplasm of prostate: Secondary | ICD-10-CM | POA: Diagnosis not present

## 2015-05-09 DIAGNOSIS — Z8546 Personal history of malignant neoplasm of prostate: Secondary | ICD-10-CM | POA: Diagnosis not present

## 2015-05-09 DIAGNOSIS — I10 Essential (primary) hypertension: Secondary | ICD-10-CM | POA: Diagnosis not present

## 2015-08-05 DIAGNOSIS — Z85820 Personal history of malignant melanoma of skin: Secondary | ICD-10-CM | POA: Diagnosis not present

## 2015-08-05 DIAGNOSIS — Z08 Encounter for follow-up examination after completed treatment for malignant neoplasm: Secondary | ICD-10-CM | POA: Diagnosis not present

## 2015-08-05 DIAGNOSIS — D225 Melanocytic nevi of trunk: Secondary | ICD-10-CM | POA: Diagnosis not present

## 2015-08-05 DIAGNOSIS — Z1283 Encounter for screening for malignant neoplasm of skin: Secondary | ICD-10-CM | POA: Diagnosis not present

## 2015-08-05 DIAGNOSIS — L570 Actinic keratosis: Secondary | ICD-10-CM | POA: Diagnosis not present

## 2015-08-05 DIAGNOSIS — D2271 Melanocytic nevi of right lower limb, including hip: Secondary | ICD-10-CM | POA: Diagnosis not present

## 2015-09-20 DIAGNOSIS — C61 Malignant neoplasm of prostate: Secondary | ICD-10-CM | POA: Diagnosis not present

## 2015-11-04 DIAGNOSIS — I10 Essential (primary) hypertension: Secondary | ICD-10-CM | POA: Diagnosis not present

## 2015-11-04 DIAGNOSIS — Z79899 Other long term (current) drug therapy: Secondary | ICD-10-CM | POA: Diagnosis not present

## 2015-11-04 DIAGNOSIS — C61 Malignant neoplasm of prostate: Secondary | ICD-10-CM | POA: Diagnosis not present

## 2015-11-04 DIAGNOSIS — E876 Hypokalemia: Secondary | ICD-10-CM | POA: Diagnosis not present

## 2015-11-04 DIAGNOSIS — Z125 Encounter for screening for malignant neoplasm of prostate: Secondary | ICD-10-CM | POA: Diagnosis not present

## 2015-11-12 DIAGNOSIS — E876 Hypokalemia: Secondary | ICD-10-CM | POA: Diagnosis not present

## 2015-11-12 DIAGNOSIS — Z8546 Personal history of malignant neoplasm of prostate: Secondary | ICD-10-CM | POA: Diagnosis not present

## 2015-11-12 DIAGNOSIS — I10 Essential (primary) hypertension: Secondary | ICD-10-CM | POA: Diagnosis not present

## 2015-11-12 DIAGNOSIS — Z6829 Body mass index (BMI) 29.0-29.9, adult: Secondary | ICD-10-CM | POA: Diagnosis not present

## 2016-01-28 DIAGNOSIS — H35033 Hypertensive retinopathy, bilateral: Secondary | ICD-10-CM | POA: Diagnosis not present

## 2016-03-16 DIAGNOSIS — C61 Malignant neoplasm of prostate: Secondary | ICD-10-CM | POA: Diagnosis not present

## 2016-04-30 DIAGNOSIS — E876 Hypokalemia: Secondary | ICD-10-CM | POA: Diagnosis not present

## 2016-05-12 DIAGNOSIS — E876 Hypokalemia: Secondary | ICD-10-CM | POA: Diagnosis not present

## 2016-05-12 DIAGNOSIS — I10 Essential (primary) hypertension: Secondary | ICD-10-CM | POA: Diagnosis not present

## 2016-09-14 DIAGNOSIS — L570 Actinic keratosis: Secondary | ICD-10-CM | POA: Diagnosis not present

## 2016-09-14 DIAGNOSIS — X32XXXD Exposure to sunlight, subsequent encounter: Secondary | ICD-10-CM | POA: Diagnosis not present

## 2016-09-14 DIAGNOSIS — D2239 Melanocytic nevi of other parts of face: Secondary | ICD-10-CM | POA: Diagnosis not present

## 2016-09-14 DIAGNOSIS — D225 Melanocytic nevi of trunk: Secondary | ICD-10-CM | POA: Diagnosis not present

## 2016-09-14 DIAGNOSIS — D485 Neoplasm of uncertain behavior of skin: Secondary | ICD-10-CM | POA: Diagnosis not present

## 2016-09-14 DIAGNOSIS — L905 Scar conditions and fibrosis of skin: Secondary | ICD-10-CM | POA: Diagnosis not present

## 2016-09-14 DIAGNOSIS — Z1283 Encounter for screening for malignant neoplasm of skin: Secondary | ICD-10-CM | POA: Diagnosis not present

## 2016-09-21 DIAGNOSIS — C61 Malignant neoplasm of prostate: Secondary | ICD-10-CM | POA: Diagnosis not present

## 2016-09-24 DIAGNOSIS — L988 Other specified disorders of the skin and subcutaneous tissue: Secondary | ICD-10-CM | POA: Diagnosis not present

## 2016-09-24 DIAGNOSIS — D225 Melanocytic nevi of trunk: Secondary | ICD-10-CM | POA: Diagnosis not present

## 2016-09-24 DIAGNOSIS — D485 Neoplasm of uncertain behavior of skin: Secondary | ICD-10-CM | POA: Diagnosis not present

## 2016-10-01 DIAGNOSIS — L989 Disorder of the skin and subcutaneous tissue, unspecified: Secondary | ICD-10-CM | POA: Diagnosis not present

## 2016-10-01 DIAGNOSIS — D485 Neoplasm of uncertain behavior of skin: Secondary | ICD-10-CM | POA: Diagnosis not present

## 2016-11-04 DIAGNOSIS — Z79899 Other long term (current) drug therapy: Secondary | ICD-10-CM | POA: Diagnosis not present

## 2016-11-04 DIAGNOSIS — I10 Essential (primary) hypertension: Secondary | ICD-10-CM | POA: Diagnosis not present

## 2016-11-04 DIAGNOSIS — D075 Carcinoma in situ of prostate: Secondary | ICD-10-CM | POA: Diagnosis not present

## 2016-11-04 DIAGNOSIS — E876 Hypokalemia: Secondary | ICD-10-CM | POA: Diagnosis not present

## 2016-11-13 ENCOUNTER — Other Ambulatory Visit (HOSPITAL_COMMUNITY): Payer: Self-pay | Admitting: Internal Medicine

## 2016-11-13 ENCOUNTER — Ambulatory Visit (HOSPITAL_COMMUNITY)
Admission: RE | Admit: 2016-11-13 | Discharge: 2016-11-13 | Disposition: A | Discharge status: Home / Self Care | Payer: Medicare Other | Source: Ambulatory Visit | Attending: Internal Medicine | Admitting: Internal Medicine

## 2016-11-13 DIAGNOSIS — M546 Pain in thoracic spine: Secondary | ICD-10-CM | POA: Insufficient documentation

## 2016-11-13 DIAGNOSIS — M47814 Spondylosis without myelopathy or radiculopathy, thoracic region: Secondary | ICD-10-CM | POA: Diagnosis not present

## 2016-11-13 DIAGNOSIS — E876 Hypokalemia: Secondary | ICD-10-CM | POA: Diagnosis not present

## 2016-11-13 DIAGNOSIS — I10 Essential (primary) hypertension: Secondary | ICD-10-CM | POA: Diagnosis not present

## 2016-12-15 DIAGNOSIS — Z79899 Other long term (current) drug therapy: Secondary | ICD-10-CM | POA: Diagnosis not present

## 2016-12-15 DIAGNOSIS — I10 Essential (primary) hypertension: Secondary | ICD-10-CM | POA: Diagnosis not present

## 2016-12-15 DIAGNOSIS — E876 Hypokalemia: Secondary | ICD-10-CM | POA: Diagnosis not present

## 2017-02-03 DIAGNOSIS — H43391 Other vitreous opacities, right eye: Secondary | ICD-10-CM | POA: Diagnosis not present

## 2017-02-03 DIAGNOSIS — H524 Presbyopia: Secondary | ICD-10-CM | POA: Diagnosis not present

## 2017-05-17 DIAGNOSIS — I10 Essential (primary) hypertension: Secondary | ICD-10-CM | POA: Diagnosis not present

## 2017-09-20 DIAGNOSIS — Z1283 Encounter for screening for malignant neoplasm of skin: Secondary | ICD-10-CM | POA: Diagnosis not present

## 2017-09-20 DIAGNOSIS — Z08 Encounter for follow-up examination after completed treatment for malignant neoplasm: Secondary | ICD-10-CM | POA: Diagnosis not present

## 2017-09-20 DIAGNOSIS — D225 Melanocytic nevi of trunk: Secondary | ICD-10-CM | POA: Diagnosis not present

## 2017-09-20 DIAGNOSIS — Z85820 Personal history of malignant melanoma of skin: Secondary | ICD-10-CM | POA: Diagnosis not present

## 2017-09-20 DIAGNOSIS — D2272 Melanocytic nevi of left lower limb, including hip: Secondary | ICD-10-CM | POA: Diagnosis not present

## 2017-09-20 DIAGNOSIS — D485 Neoplasm of uncertain behavior of skin: Secondary | ICD-10-CM | POA: Diagnosis not present

## 2017-09-20 DIAGNOSIS — C44219 Basal cell carcinoma of skin of left ear and external auricular canal: Secondary | ICD-10-CM | POA: Diagnosis not present

## 2017-11-04 LAB — BASIC METABOLIC PANEL: POTASSIUM: 2.9 — AB (ref 3.4–5.3)

## 2017-11-10 DIAGNOSIS — H35372 Puckering of macula, left eye: Secondary | ICD-10-CM | POA: Diagnosis not present

## 2017-11-23 DIAGNOSIS — I10 Essential (primary) hypertension: Secondary | ICD-10-CM | POA: Diagnosis not present

## 2017-11-23 DIAGNOSIS — Z79899 Other long term (current) drug therapy: Secondary | ICD-10-CM | POA: Diagnosis not present

## 2017-11-23 DIAGNOSIS — D075 Carcinoma in situ of prostate: Secondary | ICD-10-CM | POA: Diagnosis not present

## 2017-11-23 LAB — BASIC METABOLIC PANEL
BUN: 13 (ref 4–21)
Creatinine: 1.1 (ref <=1.3)
Potassium: 2.9 — AB (ref 3.4–5.3)

## 2017-11-30 DIAGNOSIS — E876 Hypokalemia: Secondary | ICD-10-CM | POA: Diagnosis not present

## 2017-11-30 DIAGNOSIS — I10 Essential (primary) hypertension: Secondary | ICD-10-CM | POA: Diagnosis not present

## 2017-12-29 ENCOUNTER — Emergency Department (HOSPITAL_COMMUNITY)
Admission: EM | Admit: 2017-12-29 | Discharge: 2017-12-29 | Disposition: A | Discharge status: Home / Self Care | Payer: Medicare Other | Attending: Emergency Medicine | Admitting: Emergency Medicine

## 2017-12-29 ENCOUNTER — Ambulatory Visit: Payer: Medicare Other | Admitting: "Endocrinology

## 2017-12-29 ENCOUNTER — Other Ambulatory Visit: Payer: Self-pay

## 2017-12-29 ENCOUNTER — Encounter (HOSPITAL_COMMUNITY): Payer: Self-pay | Admitting: Emergency Medicine

## 2017-12-29 ENCOUNTER — Telehealth: Payer: Self-pay | Admitting: "Endocrinology

## 2017-12-29 ENCOUNTER — Encounter: Payer: Self-pay | Admitting: "Endocrinology

## 2017-12-29 VITALS — BP 136/72 | HR 64 | Ht 69.5 in | Wt 209.0 lb

## 2017-12-29 DIAGNOSIS — R799 Abnormal finding of blood chemistry, unspecified: Secondary | ICD-10-CM | POA: Diagnosis present

## 2017-12-29 DIAGNOSIS — E876 Hypokalemia: Secondary | ICD-10-CM | POA: Diagnosis not present

## 2017-12-29 DIAGNOSIS — Z79899 Other long term (current) drug therapy: Secondary | ICD-10-CM | POA: Diagnosis not present

## 2017-12-29 DIAGNOSIS — R5383 Other fatigue: Secondary | ICD-10-CM | POA: Diagnosis not present

## 2017-12-29 DIAGNOSIS — Z7982 Long term (current) use of aspirin: Secondary | ICD-10-CM | POA: Insufficient documentation

## 2017-12-29 DIAGNOSIS — R739 Hyperglycemia, unspecified: Secondary | ICD-10-CM | POA: Diagnosis not present

## 2017-12-29 HISTORY — DX: Hypokalemia: E87.6

## 2017-12-29 LAB — CBC WITH DIFFERENTIAL/PLATELET
BASOS ABS: 0.1 10*3/uL (ref 0.0–0.1)
Basophils Relative: 0 %
Eosinophils Absolute: 0.2 10*3/uL (ref 0.0–0.7)
Eosinophils Relative: 2 %
HEMATOCRIT: 40.2 % (ref 39.0–52.0)
HEMOGLOBIN: 13.7 g/dL (ref 13.0–17.0)
LYMPHS PCT: 26 %
Lymphs Abs: 3 10*3/uL (ref 0.7–4.0)
MCH: 30.4 pg (ref 26.0–34.0)
MCHC: 34.1 g/dL (ref 30.0–36.0)
MCV: 89.1 fL (ref 78.0–100.0)
MONO ABS: 0.9 10*3/uL (ref 0.1–1.0)
Monocytes Relative: 7 %
NEUTROS ABS: 7.5 10*3/uL (ref 1.7–7.7)
NEUTROS PCT: 65 %
Platelets: 236 10*3/uL (ref 150–400)
RBC: 4.51 MIL/uL (ref 4.22–5.81)
RDW: 13.9 % (ref 11.5–15.5)
WBC: 11.6 10*3/uL — ABNORMAL HIGH (ref 4.0–10.5)

## 2017-12-29 LAB — BASIC METABOLIC PANEL
ANION GAP: 11 (ref 5–15)
BUN: 16 mg/dL (ref 6–20)
CO2: 27 mmol/L (ref 22–32)
Calcium: 8.6 mg/dL — ABNORMAL LOW (ref 8.9–10.3)
Chloride: 102 mmol/L (ref 101–111)
Creatinine, Ser: 1.32 mg/dL — ABNORMAL HIGH (ref 0.61–1.24)
GFR calc Af Amer: 58 mL/min — ABNORMAL LOW (ref >60)
GFR, EST NON AFRICAN AMERICAN: 50 mL/min — AB (ref >60)
GLUCOSE: 108 mg/dL — AB (ref 65–99)
POTASSIUM: 3 mmol/L — AB (ref 3.5–5.1)
Sodium: 140 mmol/L (ref 135–145)

## 2017-12-29 LAB — MAGNESIUM: Magnesium: 1.5 mg/dL — ABNORMAL LOW (ref 1.7–2.4)

## 2017-12-29 MED ORDER — POTASSIUM CHLORIDE CRYS ER 20 MEQ PO TBCR
40.0000 meq | EXTENDED_RELEASE_TABLET | Freq: Once | ORAL | Status: AC
  Administered 2017-12-29: 40 meq via ORAL
  Filled 2017-12-29: qty 2

## 2017-12-29 MED ORDER — POTASSIUM CHLORIDE ER 10 MEQ PO TBCR: 10.0000 meq | EXTENDED_RELEASE_TABLET | Freq: Two times a day (BID) | ORAL | 0 refills | Status: DC

## 2017-12-29 MED ORDER — MAGNESIUM SULFATE 2 GM/50ML IV SOLN
2.0000 g | Freq: Once | INTRAVENOUS | Status: AC
  Administered 2017-12-29: 2 g via INTRAVENOUS
  Filled 2017-12-29: qty 50

## 2017-12-29 NOTE — Telephone Encounter (Signed)
I Called William Gallagher and advised him of Dr Emi Holes order to go to [facility] ER and get treatment for low Potassium of 2.7 and patient agreed to go

## 2017-12-29 NOTE — ED Provider Notes (Signed)
[facility] EMERGENCY DEPARTMENT Provider Note   CSN: [id number] Arrival date & time: 12/29/17  1506     History   Chief Complaint Chief Complaint  Patient presents with  . Abnormal Lab    HPI William Gallagher is a 77 y.o. male.  HPI  Pt was seen at 2010. Per pt, c/o unknown onset and persistence of constant acute flair of his chronic "low potassium" for the past 1 year. Pt states he had labs checked by his Endo MD, and was told to come to the ED "because of my potassium." Pt denies any complaints. Denies N/V/D, no abd pain, no CP/palpitations, no cough/SOB, no back pain, no fevers, no rash, no focal motor weakness, no tingling/numbness in extremities.   Past Medical History:  Diagnosis Date  . Allergy   . Arthritis    right hand fingers  . Chronic hypokalemia   . HOH (hard of hearing)    right ear tinitis  . Hx of radiation therapy 07/04/14- 08/22/14   prostate bed 6600 cGy in 33 sessions  . Prostate cancer ([place]) 04/19/2001   Gleason 6    Patient Active Problem List   Diagnosis Date Noted  . Malignant neoplasm of prostate ([place]) 06/19/2014    Past Surgical History:  Procedure Laterality Date  . cataract surgery Bilateral    4-5 years ago  . PROSTATE BIOPSY  04/19/2001   adenocarcinoma       Home Medications    Prior to Admission medications   Medication Sig Start Date End Date Taking? Authorizing Provider  amLODipine (NORVASC) 10 MG tablet Take 10 mg by mouth daily.     [provider]  aspirin 81 MG tablet Take 81 mg by mouth daily.     [provider]  losartan (COZAAR) 100 MG tablet Take 100 mg by mouth daily.     [provider]    Family History Family History  Problem Relation Age of Onset  . Cancer Mother        on neck  . Kidney failure Father   . Cancer Paternal Uncle        unknown    Social History Social History   Tobacco Use  . Smoking status: Never Smoker  . Smokeless tobacco: Never Used  Substance Use  Topics  . Alcohol use: No  . Drug use: No     Allergies   Tape   Review of Systems Review of Systems ROS: Statement: All systems negative except as marked or noted in the HPI; Constitutional: Negative for fever and chills. ; ; Eyes: Negative for eye pain, redness and discharge. ; ; ENMT: Negative for ear pain, hoarseness, nasal congestion, sinus pressure and sore throat. ; ; Cardiovascular: Negative for chest pain, palpitations, diaphoresis, dyspnea and peripheral edema. ; ; Respiratory: Negative for cough, wheezing and stridor. ; ; Gastrointestinal: Negative for nausea, vomiting, diarrhea, abdominal pain, blood in stool, hematemesis, jaundice and rectal bleeding. . ; ; Genitourinary: Negative for dysuria, flank pain and hematuria. ; ; Musculoskeletal: Negative for back pain and neck pain. Negative for swelling and trauma.; ; Skin: Negative for pruritus, rash, abrasions, blisters, bruising and skin lesion.; ; Neuro: Negative for headache, lightheadedness and neck stiffness. Negative for weakness, altered level of consciousness, altered mental status, extremity weakness, paresthesias, involuntary movement, seizure and syncope.       Physical Exam Updated Vital Signs BP (!) 148/80 (BP Location: Left Arm)   Pulse 68   Temp 98.4 F (36.9  C) (Oral)   Resp 18   Ht 5\' 10"  (1.778 m)   Wt 94.8 kg (209 lb)   SpO2 97%   BMI 29.99 kg/m   Physical Exam 2015: Physical examination:  Nursing notes reviewed; Vital signs and O2 SAT reviewed;  Constitutional: Well developed, Well nourished, Well hydrated, In no acute distress; Head:  Normocephalic, atraumatic; Eyes: EOMI, PERRL, No scleral icterus; ENMT: Mouth and pharynx normal, Mucous membranes moist; Neck: Supple, Full range of motion, No lymphadenopathy; Cardiovascular: Regular rate and rhythm, No murmur, rub, or gallop; Respiratory: Breath sounds clear & equal bilaterally, No rales, rhonchi, wheezes.  Speaking full sentences with ease, Normal  respiratory effort/excursion; Chest: Nontender, Movement normal; Abdomen: Soft, Nontender, Nondistended, Normal bowel sounds; Genitourinary: No CVA tenderness; Extremities: Pulses normal, No tenderness, No edema, No calf edema or asymmetry.; Neuro: AA&Ox3, Major CN grossly intact.  Speech clear. No gross focal motor or sensory deficits in extremities. Climbs on and off stretcher easily by himself. Gait steady..; Skin: Color normal, Warm, Dry.   ED Treatments / Results  Labs (all labs ordered are listed, but only abnormal results are displayed)   EKG  EKG Interpretation  Date/Time:  Wednesday December 29 2017 18:14:23 EST Ventricular Rate:  59 PR Interval:  182 QRS Duration: 106 QT Interval:  452 QTC Calculation: 447 R Axis:   66 Text Interpretation:  Sinus bradycardia Otherwise normal ECG Artifact When compared with ECG of 06/21/2001 No significant change was found Confirmed by Francine Graven [id number]) on 12/29/2017 8:33:25 PM       Radiology   Procedures Procedures (including critical care time)  Medications Ordered in ED Medications - No data to display   Initial Impression / Assessment and Plan / ED Course  I have reviewed the triage vital signs and the nursing notes.  Pertinent labs & imaging results that were available during my care of the patient were reviewed by me and considered in my medical decision making (see chart for details).  MDM Reviewed: previous chart, nursing note and vitals Reviewed previous: labs and ECG Interpretation: labs and ECG   Results for orders placed or performed during the hospital encounter of 85/46/27  Basic metabolic panel  Result Value Ref Range   Sodium 140 135 - 145 mmol/L   Potassium 3.0 (L) 3.5 - 5.1 mmol/L   Chloride 102 101 - 111 mmol/L   CO2 27 22 - 32 mmol/L   Glucose, Bld 108 (H) 65 - 99 mg/dL   BUN 16 6 - 20 mg/dL   Creatinine, Ser 1.32 (H) 0.61 - 1.24 mg/dL   Calcium 8.6 (L) 8.9 - 10.3 mg/dL   GFR calc non Af Amer 50  (L) >60 mL/min   GFR calc Af Amer 58 (L) >60 mL/min   Anion gap 11 5 - 15  CBC with Differential  Result Value Ref Range   WBC 11.6 (H) 4.0 - 10.5 K/uL   RBC 4.51 4.22 - 5.81 MIL/uL   Hemoglobin 13.7 13.0 - 17.0 g/dL   HCT 40.2 39.0 - 52.0 %   MCV 89.1 78.0 - 100.0 fL   MCH 30.4 26.0 - 34.0 pg   MCHC 34.1 30.0 - 36.0 g/dL   RDW 13.9 11.5 - 15.5 %   Platelets 236 150 - 400 K/uL   Neutrophils Relative % 65 %   Neutro Abs 7.5 1.7 - 7.7 K/uL   Lymphocytes Relative 26 %   Lymphs Abs 3.0 0.7 - 4.0 K/uL   Monocytes Relative 7 %  Monocytes Absolute 0.9 0.1 - 1.0 K/uL   Eosinophils Relative 2 %   Eosinophils Absolute 0.2 0.0 - 0.7 K/uL   Basophils Relative 0 %   Basophils Absolute 0.1 0.0 - 0.1 K/uL  Magnesium  Result Value Ref Range   Magnesium 1.5 (L) 1.7 - 2.4 mg/dL    2100:  Pt asymptomatic. BUN/Cr per baseline. Will replete potassium PO and magnesium IV.  No indication for admission at this time. Pt agreeable with this plan. Dx and testing d/w pt and family.  Questions answered.  Verb understanding, agreeable to d/c home with outpt f/u.   Final Clinical Impressions(s) / ED Diagnoses   Final diagnoses:  None    ED Discharge Orders    None       Francine Graven, DO 12/31/17 2047

## 2017-12-29 NOTE — Discharge Instructions (Signed)
Take the prescription as directed.  Call your regular medical doctor tomorrow to schedule a follow up appointment within the next 2 to 3 days to re-check your magnesium and potassium levels.  Return to the Emergency Department immediately sooner if worsening.

## 2017-12-29 NOTE — Progress Notes (Addendum)
Lab scored for critical values on his potassium at 2.7. This level is critical and not stable for outpatient treatment. Patient would need immediate assessment including with EKG. Patient was called at home to come to emergency room for evaluation and treatment of severe hypokalemia.          Consult Note                                            12/29/2017, 2:15 PM   Subjective:    Patient ID: William Gallagher, male    DOB: 05/16/1941, PCP Asencion Noble, MD   Past Medical History:  Diagnosis Date  . Allergy   . Arthritis    right hand fingers  . HOH (hard of hearing)    right ear tinitis  . Hx of radiation therapy 07/04/14- 08/22/14   prostate bed 6600 cGy in 33 sessions  . Prostate cancer ([place]) 04/19/2001   Gleason 6   Past Surgical History:  Procedure Laterality Date  . cataract surgery Bilateral    4-5 years ago  . PROSTATE BIOPSY  04/19/2001   adenocarcinoma   Social History   Socioeconomic History  . Marital status: Married    Spouse name: None  . Number of children: None  . Years of education: None  . Highest education level: None  Social Needs  . Financial resource strain: None  . Food insecurity - worry: None  . Food insecurity - inability: None  . Transportation needs - medical: None  . Transportation needs - non-medical: None  Occupational History  . None  Tobacco Use  . Smoking status: Never Smoker  . Smokeless tobacco: Never Used  Substance and Sexual Activity  . Alcohol use: No  . Drug use: No  . Sexual activity: None  Other Topics Concern  . None  Social History Narrative  . None   Outpatient Encounter Medications as of 12/29/2017  Medication Sig  . amLODipine (NORVASC) 10 MG tablet Take 10 mg by mouth daily.   Marland Kitchen aspirin 81 MG tablet Take 81 mg by mouth daily.   Marland Kitchen losartan (COZAAR) 100 MG tablet Take 100 mg by mouth daily.    No facility-administered encounter medications on file as of 12/29/2017.    ALLERGIES: Allergies  Allergen Reactions  .  Tape Other (See Comments)    Adhesive, blistering    VACCINATION STATUS:  There is no immunization history on file for this patient.  HPI William Gallagher is 77 y.o. male who presents today with a medical history as above. he is being seen in consultation for hypokalemia requested by Asencion Noble, MD.  - Agent is a poor historian. Review of her referral package shows that he carries a diagnosis of hypokalemia at least since August 2013. - His most recent 2 labs from 11/23/2017 and 02/04/2017 both show potassium level of 2.9. He is not on potassium supplement. - He denies muscular weakness, history of cardiac dysfunction or dysrhythmia. - No history of renal dysfunction. - He is a referral package did not include any record of EKG and patient states that he never had any EKG. - He denies any family history of potassium problem. - He denies diarrhea, vomiting, or any body fluid loss. He is not on any diuretics. - He is more sedentary in his life with very minimal exertion. He has history of prostate cancer  treated in 1960s. - He has no particular acute symptoms or complaints today. He states that he is seeing me because his doctor " told him to".  Review of Systems  Constitutional:  No recent weight change,  no fatigue, no subjective hyperthermia, no subjective hypothermia Eyes: no blurry vision, no xerophthalmia ENT: no sore throat, no nodules palpated in throat, no dysphagia/odynophagia, no hoarseness Cardiovascular: no Chest Pain, no Shortness of Breath, no palpitations, no leg swelling Respiratory: no cough, no SOB Gastrointestinal: no Nausea/Vomiting/Diarhhea Musculoskeletal: no muscle/joint aches Skin: no rashes Neurological: no tremors, no numbness, no tingling, no dizziness Psychiatric: no depression, no anxiety  Objective:    BP 136/72   Pulse 64   Ht 5' 9.5" (1.765 m)   Wt 209 lb (94.8 kg)   BMI 30.42 kg/m   Wt Readings from Last 3 Encounters:  12/29/17 209 lb (94.8  kg)  09/25/14 212 lb 9.6 oz (96.4 kg)  08/21/14 212 lb 1.6 oz (96.2 kg)    Physical Exam  Constitutional: + obese, not in acute distress , + reluctant state of mind, + hard of hearing Eyes: PERRLA, EOMI, no exophthalmos ENT: moist mucous membranes, no thyromegaly, no cervical lymphadenopathy Cardiovascular: normal precordial activity, Regular Rate and Rhythm, no Murmur/Rubs/Gallops Respiratory:  adequate breathing efforts, no gross chest deformity, Clear to auscultation bilaterally Gastrointestinal: abdomen soft, Non -tender, No distension, Bowel Sounds present Musculoskeletal: no gross deformities, strength intact in all four extremities Skin: moist, warm, no rashes Neurological: no tremor with outstretched hands, Deep tendon reflexes normal in all four extremities.     Lab Results  Component Value Date   TSH 2.37 12/29/2017   FREET4 1.2 12/29/2017       labs from 11/23/2017 showed potassium 2.9, normal renal function. Labs from 11/04/2017 showed potassium 2.9, normal renal function.    Assessment & Plan:   1. Hypokalemia- severe  - William Gallagher  is being seen at a kind request of Asencion Noble, MD. -  Patient is a poor historian. I have reviewed his available records and clinically evaluated the patient. - Based on my reviews, he has  chronic to subacute hypokalemia, currently not on any potassium supplement.  - He does not display any symptoms or signs of hypokalemia. - I have explained to the patient the fact that severe hypokalemia is a medical emergency and he has to stick to the plan to finish workup and treatment plan. -  Etiology of his hypoglycemia is not determined, no clear risk factors for potassium loss such as diarrhea, vomiting, or chronic diuretic use. - From his history and physical exam, no indication for pre-distributive hypokalemia such as ileus or third spacing. - The most immediate plan would be to repeat his CMP along with serum for magnesium  phosphorus, tired function test, A1c. He was sent to lab. Labs. - If his potassium remains below 3 mEq/L, he will need emergency room evaluation and treatment. He will also require to have baseline EKG to assess cardiac manifestations of hypokalemia. - If his potassium is above 3 mEq per liter in light of the fact that he is a symptomatic, he will be initiated on potassium supplements. - If he has hypomagnesemia, hypokalemia would be refractory to treatment, hence would need magnesium supplement concurrently.  - COMPLETE METABOLIC PANEL WITH GFR - Hemoglobin A1c - TSH - T4, free - Magnesium - Phosphorus - I will watch for his STAT labs to return and we will call patient for plans accordingly. Patient  seemed unconcerned and unreliable, gave him appointment for return visit in one week just in case. - On elective basis, he would need more work up to rule out etiologies such as primary hyperaldosteronism. - He does not have family history of electrolyte abnormalities, hence the rare etiologies of hypokalemia  Such as Gitelman's and Bartter syndrome are unlikely at this time.  - Time spent with the patient: 45 min, of which >50% was spent in reviewing his current and  previous labs, previous treatments, and medications/ doses and developing a plan for long-term care.   - I advised patient to maintain close follow up with Asencion Noble, MD for primary care needs. Follow up plan: Return in about 1 week (around 01/05/2018) for labs today STAT.   Glade Lloyd, MD [facility] [address] [place], [place] [address] Phone: [phone]  Fax: [phone]     12/29/2017, 2:15 PM  This note was partially dictated with voice recognition software. Similar sounding words can be transcribed inadequately or may not  be corrected upon review.

## 2017-12-29 NOTE — ED Triage Notes (Signed)
Patient was contacted by his PCP and told to come to the ED for low potassium.

## 2017-12-29 NOTE — ED Triage Notes (Signed)
Pt reassessed. EKG completed. Delay explained to patient and family.

## 2017-12-30 LAB — COMPLETE METABOLIC PANEL WITH GFR
AG Ratio: 1.4 (calc) (ref 1.0–2.5)
ALKALINE PHOSPHATASE (APISO): 80 U/L (ref 40–115)
ALT: 11 U/L (ref 9–46)
AST: 12 U/L (ref 10–35)
Albumin: 3.9 g/dL (ref 3.6–5.1)
BILIRUBIN TOTAL: 0.9 mg/dL (ref 0.2–1.2)
BUN/Creatinine Ratio: 10 (calc) (ref 6–22)
BUN: 13 mg/dL (ref 7–25)
CALCIUM: 9.1 mg/dL (ref 8.6–10.3)
CHLORIDE: 101 mmol/L (ref 98–110)
CO2: 31 mmol/L (ref 20–32)
Creat: 1.33 mg/dL — ABNORMAL HIGH (ref 0.70–1.18)
GFR, EST NON AFRICAN AMERICAN: 51 mL/min/{1.73_m2} — AB (ref >=60)
GFR, Est African American: 59 mL/min/{1.73_m2} — ABNORMAL LOW (ref >=60)
GLOBULIN: 2.7 g/dL (ref 1.9–3.7)
Glucose, Bld: 113 mg/dL (ref 65–139)
POTASSIUM: 2.7 mmol/L — AB (ref 3.5–5.3)
Sodium: 141 mmol/L (ref 135–146)
Total Protein: 6.6 g/dL (ref 6.1–8.1)

## 2017-12-30 LAB — TSH: TSH: 2.37 m[IU]/L (ref 0.40–4.50)

## 2017-12-30 LAB — MAGNESIUM: MAGNESIUM: 1.6 mg/dL (ref 1.5–2.5)

## 2017-12-30 LAB — PHOSPHORUS: Phosphorus: 1.2 mg/dL — ABNORMAL LOW (ref 2.1–4.3)

## 2017-12-30 LAB — HEMOGLOBIN A1C
EAG (MMOL/L): 6.2 (calc)
Hgb A1c MFr Bld: 5.5 % of total Hgb (ref <5.7)
MEAN PLASMA GLUCOSE: 111 (calc)

## 2017-12-30 LAB — T4, FREE: FREE T4: 1.2 ng/dL (ref 0.8–1.8)

## 2017-12-31 DIAGNOSIS — E876 Hypokalemia: Secondary | ICD-10-CM | POA: Diagnosis not present

## 2018-01-12 ENCOUNTER — Ambulatory Visit (INDEPENDENT_AMBULATORY_CARE_PROVIDER_SITE_OTHER): Payer: Medicare Other | Admitting: "Endocrinology

## 2018-01-12 ENCOUNTER — Encounter: Payer: Self-pay | Admitting: "Endocrinology

## 2018-01-12 VITALS — BP 137/79 | HR 60 | Ht 69.5 in | Wt 215.0 lb

## 2018-01-12 DIAGNOSIS — E876 Hypokalemia: Secondary | ICD-10-CM | POA: Diagnosis not present

## 2018-01-12 MED ORDER — POTASSIUM CHLORIDE CRYS ER 20 MEQ PO TBCR: 20.0000 meq | EXTENDED_RELEASE_TABLET | Freq: Two times a day (BID) | ORAL | 2 refills | Status: AC

## 2018-01-12 MED ORDER — MAGNESIUM 100 MG PO TABS: 1.0000 | ORAL_TABLET | Freq: Every day | ORAL | 2 refills | Status: DC

## 2018-01-12 MED ORDER — MAGNESIUM-POTASSIUM 40-40 MG PO CAPS: 1.0000 | ORAL_CAPSULE | Freq: Every day | ORAL | 3 refills | Status: DC

## 2018-01-12 MED ORDER — CALCIUM-PHOSPHORUS-VITAMIN D 250-107-500 MG-MG-UNIT PO CHEW: 1.0000 | CHEWABLE_TABLET | Freq: Every day | ORAL | 3 refills | Status: DC

## 2018-01-12 NOTE — Progress Notes (Signed)
Endocrinology Follow up Note                                            01/12/2018, 6:22 PM   Subjective:    Patient ID: William Gallagher, male    DOB: Oct 22, 1941, PCP Asencion Noble, MD   Past Medical History:  Diagnosis Date  . Allergy   . Arthritis    right hand fingers  . Chronic hypokalemia   . HOH (hard of hearing)    right ear tinitis  . Hx of radiation therapy 07/04/14- 08/22/14   prostate bed 6600 cGy in 33 sessions  . Prostate cancer ([place]) 04/19/2001   Gleason 6   Past Surgical History:  Procedure Laterality Date  . cataract surgery Bilateral    4-5 years ago  . PROSTATE BIOPSY  04/19/2001   adenocarcinoma   Social History   Socioeconomic History  . Marital status: Married    Spouse name: None  . Number of children: None  . Years of education: None  . Highest education level: None  Social Needs  . Financial resource strain: None  . Food insecurity - worry: None  . Food insecurity - inability: None  . Transportation needs - medical: None  . Transportation needs - non-medical: None  Occupational History  . None  Tobacco Use  . Smoking status: Never Smoker  . Smokeless tobacco: Never Used  Substance and Sexual Activity  . Alcohol use: No  . Drug use: No  . Sexual activity: None  Other Topics Concern  . None  Social History Narrative  . None   Outpatient Encounter Medications as of 01/12/2018  Medication Sig  . amLODipine (NORVASC) 10 MG tablet Take 10 mg by mouth daily.   Marland Kitchen aspirin 81 MG tablet Take 81 mg by mouth daily.   . Calcium-Phosphorus-Vitamin D [phone] MG-MG-UNIT CHEW Chew 1 tablet by mouth daily.  Marland Kitchen losartan (COZAAR) 100 MG tablet Take 100 mg by mouth daily.   . Magnesium 100 MG TABS Take 1 tablet (100 mg total) by mouth daily with breakfast.  . potassium chloride SA (K-DUR,KLOR-CON) 20 MEQ tablet Take 1 tablet (20 mEq total) by mouth 2 (two) times daily.  . [DISCONTINUED] Magnesium-Potassium 40-40 MG CAPS Take 1 tablet by mouth daily.   . [DISCONTINUED] potassium chloride (K-DUR) 10 MEQ tablet Take 1 tablet (10 mEq total) by mouth 2 (two) times daily.   No facility-administered encounter medications on file as of 01/12/2018.    ALLERGIES: Allergies  Allergen Reactions  . Tape Other (See Comments)    Adhesive, blistering    VACCINATION STATUS:  There is no immunization history on file for this patient.  HPI William Gallagher is 77 y.o. male who presents today for follow-up, after he was seen in consultation for hypokalemia 2 weeks ago. -  Patient remains a poor historian.  -On his last visit, he was sent for stat labs which revealed severe hypokalemia of 2.7.  He was sent to emergency room, after potassium supplement he was discharged on potassium chloride 10 mEq p.o. twice daily.   -His other labs revealed hypomagnesemia, hypocalcemia, hypophosphatemia.   - Review of her referral package shows that he carries a diagnosis of hypokalemia at least since August 2013.  - He denies muscular weakness, history of cardiac dysfunction or dysrhythmia. -His EKG during his ER visit showed  sinus bradycardia. - No history of renal dysfunction. - He denies any family history of potassium problem. - He denies diarrhea, vomiting, or any body fluid loss. He is not on any diuretics. - He is more sedentary in his life with very minimal exertion. He has history of prostate cancer treated in 1960s. - He has no particular acute symptoms or complaints today.    Review of Systems  Constitutional:  + gaining weight, no fatigue, no subjective hyperthermia, no subjective hypothermia Eyes: no blurry vision, no xerophthalmia ENT: no sore throat, no nodules palpated in throat, no dysphagia/odynophagia, no hoarseness Cardiovascular: No chest pain, no shortness of breath, no palpitations.   Respiratory: no cough, no SOB Gastrointestinal: no Nausea/Vomiting/Diarhhea Musculoskeletal: no muscle/joint aches Skin: no rashes Neurological: no  tremors, no numbness, no tingling, no dizziness Psychiatric: no depression, no anxiety  Objective:    BP 137/79   Pulse 60   Ht 5' 9.5" (1.765 m)   Wt 215 lb (97.5 kg)   BMI 31.29 kg/m   Wt Readings from Last 3 Encounters:  01/12/18 215 lb (97.5 kg)  12/29/17 209 lb (94.8 kg)  12/29/17 209 lb (94.8 kg)    Physical Exam  Constitutional: + obese, not in acute distress , + reluctant state of mind, + hard of hearing Eyes: PERRLA, EOMI, no exophthalmos ENT: moist mucous membranes, no thyromegaly, no cervical lymphadenopathy Cardiovascular: normal precordial activity, Regular Rate and Rhythm, no Murmur/Rubs/Gallops Respiratory:  adequate breathing efforts, no gross chest deformity, Clear to auscultation bilaterally Gastrointestinal: abdomen soft, Non -tender, No distension, Bowel Sounds present Musculoskeletal: no gross deformities, strength intact in all four extremities Skin: moist, warm, no rashes Neurological: no tremor with outstretched hands, Deep tendon reflexes normal in all four extremities.     Lab Results  Component Value Date   TSH 2.37 12/29/2017   FREET4 1.2 12/29/2017    Recent Results (from the past 2160 hour(s))  Basic metabolic panel     Status: Abnormal   Collection Time: 11/04/17 12:00 AM  Result Value Ref Range   Potassium 2.9 (A) 3.4 - 5.3  Basic metabolic panel     Status: Abnormal   Collection Time: 11/23/17 12:00 AM  Result Value Ref Range   BUN 13 4 - 21   Creatinine 1.1 0.6 - 1.3   Potassium 2.9 (A) 3.4 - 5.3  COMPLETE METABOLIC PANEL WITH GFR     Status: Abnormal   Collection Time: 12/29/17  9:39 AM  Result Value Ref Range   Glucose, Bld 113 65 - 139 mg/dL    Comment: .        Non-fasting reference interval .    BUN 13 7 - 25 mg/dL   Creat 1.33 (H) 0.70 - 1.18 mg/dL    Comment: For patients >74 years of age, the reference limit for Creatinine is approximately 13% higher for people identified as African-American. .    GFR, Est Non  African American 51 (L) > OR = 60 mL/min/1.39m   GFR, Est African American 59 (L) > OR = 60 mL/min/1.720m  BUN/Creatinine Ratio 10 6 - 22 (calc)   Sodium 141 135 - 146 mmol/L   Potassium 2.7 (LL) 3.5 - 5.3 mmol/L    Comment: Verified by repeat analysis. .    Chloride 101 98 - 110 mmol/L   CO2 31 20 - 32 mmol/L   Calcium 9.1 8.6 - 10.3 mg/dL   Total Protein 6.6 6.1 - 8.1 g/dL   Albumin 3.9 3.6 -  5.1 g/dL   Globulin 2.7 1.9 - 3.7 g/dL (calc)   AG Ratio 1.4 1.0 - 2.5 (calc)   Total Bilirubin 0.9 0.2 - 1.2 mg/dL   Alkaline phosphatase (APISO) 80 40 - 115 U/L   AST 12 10 - 35 U/L   ALT 11 9 - 46 U/L  Hemoglobin A1c     Status: None   Collection Time: 12/29/17  9:39 AM  Result Value Ref Range   Hgb A1c MFr Bld 5.5 <5.7 % of total Hgb    Comment: For the purpose of screening for the presence of diabetes: . <5.7%       Consistent with the absence of diabetes 5.7-6.4%    Consistent with increased risk for diabetes             (prediabetes) > or =6.5%  Consistent with diabetes . This assay result is consistent with a decreased risk of diabetes. . Currently, no consensus exists regarding use of hemoglobin A1c for diagnosis of diabetes in children. . According to American Diabetes Association (ADA) guidelines, hemoglobin A1c <7.0% represents optimal control in non-pregnant diabetic patients. Different metrics may apply to specific patient populations.  Standards of Medical Care in Diabetes(ADA). .    Mean Plasma Glucose 111 (calc)   eAG (mmol/L) 6.2 (calc)  TSH     Status: None   Collection Time: 12/29/17  9:39 AM  Result Value Ref Range   TSH 2.37 0.40 - 4.50 mIU/L  T4, free     Status: None   Collection Time: 12/29/17  9:39 AM  Result Value Ref Range   Free T4 1.2 0.8 - 1.8 ng/dL  Magnesium     Status: None   Collection Time: 12/29/17  9:39 AM  Result Value Ref Range   Magnesium 1.6 1.5 - 2.5 mg/dL  Phosphorus     Status: Abnormal   Collection Time: 12/29/17  9:39  AM  Result Value Ref Range   Phosphorus 1.2 (L) 2.1 - 4.3 mg/dL  Basic metabolic panel     Status: Abnormal   Collection Time: 12/29/17  8:09 PM  Result Value Ref Range   Sodium 140 135 - 145 mmol/L   Potassium 3.0 (L) 3.5 - 5.1 mmol/L   Chloride 102 101 - 111 mmol/L   CO2 27 22 - 32 mmol/L   Glucose, Bld 108 (H) 65 - 99 mg/dL   BUN 16 6 - 20 mg/dL   Creatinine, Ser 1.32 (H) 0.61 - 1.24 mg/dL   Calcium 8.6 (L) 8.9 - 10.3 mg/dL   GFR calc non Af Amer 50 (L) >60 mL/min   GFR calc Af Amer 58 (L) >60 mL/min    Comment: (NOTE) The eGFR has been calculated using the CKD EPI equation. This calculation has not been validated in all clinical situations. eGFR's persistently <60 mL/min signify possible Chronic Kidney Disease.    Anion gap 11 5 - 15  CBC with Differential     Status: Abnormal   Collection Time: 12/29/17  8:09 PM  Result Value Ref Range   WBC 11.6 (H) 4.0 - 10.5 K/uL   RBC 4.51 4.22 - 5.81 MIL/uL   Hemoglobin 13.7 13.0 - 17.0 g/dL   HCT 40.2 39.0 - 52.0 %   MCV 89.1 78.0 - 100.0 fL   MCH 30.4 26.0 - 34.0 pg   MCHC 34.1 30.0 - 36.0 g/dL   RDW 13.9 11.5 - 15.5 %   Platelets 236 150 - 400 K/uL   Neutrophils Relative %  65 %   Neutro Abs 7.5 1.7 - 7.7 K/uL   Lymphocytes Relative 26 %   Lymphs Abs 3.0 0.7 - 4.0 K/uL   Monocytes Relative 7 %   Monocytes Absolute 0.9 0.1 - 1.0 K/uL   Eosinophils Relative 2 %   Eosinophils Absolute 0.2 0.0 - 0.7 K/uL   Basophils Relative 0 %   Basophils Absolute 0.1 0.0 - 0.1 K/uL  Magnesium     Status: Abnormal   Collection Time: 12/29/17  8:09 PM  Result Value Ref Range   Magnesium 1.5 (L) 1.7 - 2.4 mg/dL      labs from 11/23/2017 showed potassium 2.9, normal renal function. Labs from 11/04/2017 showed potassium 2.9, normal renal function.    Assessment & Plan:   1. Hypokalemia-improving 2.  Hypomagnesemia 3. hypophosphatemia 4.  hypocalcemia  -  Patient remains a poor historian.  His repeat labs show more electrolyte  deficiencies. -On his last visit, he was sent for stat labs which revealed severe hypokalemia of 2.7.  He was sent to emergency room, after potassium supplement he was discharged on potassium chloride 10 mEq p.o. twice daily.   -He is other labs revealed hypomagnesemia, hypocalcemia, hypophosphatemia.    - He does not display any symptoms or signs of hypokalemia.  -  Etiology of his multiple electrolyte deficiencies is not determined, no clear risk factors for electrolytes loss such as diarrhea, vomiting, or chronic diuretic use. - From his history and physical exam, no indication for distributive hypokalemia such as ileus or third spacing.  -I have sent him to lab to assess potassium correction. -I discussed and initiated supplements of multiple electrolytes including magnesium, calcium, phosphorus, potassium, and vitamin D.  -The hypomagnesemia he has might have made hypokalemia refractory to treatment, hence, he will benefit from concurrent supplements with magnesium and other electrolytes.  - On elective basis, in 8 weeks, he would need more work up to rule out etiologies such as primary hyperaldosteronism. - He does not have family history of electrolyte abnormalities, hence the rare etiologies of hypokalemia  Such as Gitelman's and Bartter syndrome are unlikely at this time.  - Time spent with the patient: 25 min, of which >50% was spent in reviewing her  current and  previous labs, previous treatments, and medications  doses and developing a plan for long-term care.     - I advised patient to maintain close follow up with Asencion Noble, MD for primary care needs. Follow up plan: Return in about 8 weeks (around 03/09/2018) for labs today, follow up with pre-visit labs.   Glade Lloyd, MD [facility] [address] [place], [place] [address] Phone: [phone]  Fax: [phone]     01/12/2018, 6:22 PM  This note was partially  dictated with voice recognition software. Similar sounding words can be transcribed inadequately or may not  be corrected upon review.

## 2018-01-13 ENCOUNTER — Other Ambulatory Visit: Payer: Self-pay | Admitting: "Endocrinology

## 2018-01-13 LAB — PTH, INTACT AND CALCIUM
CALCIUM: 9.4 mg/dL (ref 8.6–10.3)
PTH: 113 pg/mL — AB (ref 14–64)

## 2018-01-13 LAB — VITAMIN D 25 HYDROXY (VIT D DEFICIENCY, FRACTURES): Vit D, 25-Hydroxy: 8 ng/mL — ABNORMAL LOW (ref 30–100)

## 2018-01-13 MED ORDER — VITAMIN D3 125 MCG (5000 UT) PO CAPS: 5000.0000 [IU] | ORAL_CAPSULE | Freq: Every day | ORAL | 0 refills | Status: DC

## 2018-03-07 DIAGNOSIS — E876 Hypokalemia: Secondary | ICD-10-CM | POA: Diagnosis not present

## 2018-03-10 ENCOUNTER — Encounter: Payer: Self-pay | Admitting: "Endocrinology

## 2018-03-10 ENCOUNTER — Ambulatory Visit (INDEPENDENT_AMBULATORY_CARE_PROVIDER_SITE_OTHER): Payer: Medicare Other | Admitting: "Endocrinology

## 2018-03-10 VITALS — BP 113/68 | HR 60 | Ht 69.5 in | Wt 211.0 lb

## 2018-03-10 DIAGNOSIS — E876 Hypokalemia: Secondary | ICD-10-CM | POA: Diagnosis not present

## 2018-03-10 NOTE — Progress Notes (Signed)
Endocrinology Follow up Note                                            03/10/2018, 1:23 PM   Subjective:    Patient ID: William Gallagher, male    DOB: [birth year], PCP Asencion Noble, MD   Past Medical History:  Diagnosis Date  . Allergy   . Arthritis    right hand fingers  . Chronic hypokalemia   . HOH (hard of hearing)    right ear tinitis  . Hx of radiation therapy 07/04/14- 08/22/14   prostate bed 6600 cGy in 33 sessions  . Prostate cancer ([place]) 04/19/2001   Gleason 6   Past Surgical History:  Procedure Laterality Date  . cataract surgery Bilateral    4-5 years ago  . PROSTATE BIOPSY  04/19/2001   adenocarcinoma   Social History   Socioeconomic History  . Marital status: Married    Spouse name: Not on file  . Number of children: Not on file  . Years of education: Not on file  . Highest education level: Not on file  Occupational History  . Not on file  Social Needs  . Financial resource strain: Not on file  . Food insecurity:    Worry: Not on file    Inability: Not on file  . Transportation needs:    Medical: Not on file    Non-medical: Not on file  Tobacco Use  . Smoking status: Never Smoker  . Smokeless tobacco: Never Used  Substance and Sexual Activity  . Alcohol use: No  . Drug use: No  . Sexual activity: Not on file  Lifestyle  . Physical activity:    Days per week: Not on file    Minutes per session: Not on file  . Stress: Not on file  Relationships  . Social connections:    Talks on phone: Not on file    Gets together: Not on file    Attends religious service: Not on file    Active member of club or organization: Not on file    Attends meetings of clubs or organizations: Not on file    Relationship status: Not on file  Other Topics Concern  . Not on file  Social History Narrative  . Not on file   Outpatient Encounter Medications as of 03/10/2018  Medication Sig  . amLODipine (NORVASC) 10 MG tablet Take 10 mg by mouth daily.   Marland Kitchen aspirin  81 MG tablet Take 81 mg by mouth daily.   . Calcium-Phosphorus-Vitamin D [phone] MG-MG-UNIT CHEW Chew 1 tablet by mouth daily.  . Cholecalciferol (VITAMIN D3) 5000 units CAPS Take 1 capsule (5,000 Units total) by mouth daily.  Marland Kitchen losartan (COZAAR) 100 MG tablet Take 100 mg by mouth daily.   . Magnesium 100 MG TABS Take 1 tablet (100 mg total) by mouth daily with breakfast.  . potassium chloride SA (K-DUR,KLOR-CON) 20 MEQ tablet Take 1 tablet (20 mEq total) by mouth 2 (two) times daily.   No facility-administered encounter medications on file as of 03/10/2018.    ALLERGIES: Allergies  Allergen Reactions  . Tape Other (See Comments)    Adhesive, blistering    VACCINATION STATUS:  There is no immunization history on file for this patient.  HPI William Gallagher is 77 y.o. male who presents today for follow-up, after he  was seen in consultation for hypokalemia. -On subsequent workup he was found to have deficiencies in several important elements including potassium, magnesium, calcium, vitamin D.  -He has responded very well to the supplements prescribed last visit.  -  Patient remains a poor historian, he has no new complaints. -Potassium has improved to 3.5 from as low as 2.,  Magnesium improved to 1.6, phosphorus improved to 2.5, and calcium improved to 9.1 from 8.6.   - He denies muscular weakness, history of cardiac dysfunction or dysrhythmia. -His EKG during his ER visit showed  sinus bradycardia. -He has had stage 3  renal insufficiency which is improving.   - He denies any family history of potassium problem. - He denies diarrhea, vomiting, or any body fluid loss. He is not on any diuretics. - He is more sedentary in his life with very minimal exertion. He has history of prostate cancer treated in 1960s.  Review of Systems  Constitutional:  + He has a steady weight,  no fatigue, no subjective hyperthermia, no subjective hypothermia Eyes: no blurry vision, no  xerophthalmia ENT: no sore throat, no nodules palpated in throat, no dysphagia/odynophagia, no hoarseness Cardiovascular: No chest pain, no palpitations, no shortness of breath.   Respiratory: no cough, no SOB Gastrointestinal: no Nausea/Vomiting/Diarhhea Musculoskeletal: no muscle/joint aches Skin: no rashes Neurological: no tremors, no numbness, no tingling, no dizziness Psychiatric: no depression, no anxiety  Objective:    BP 113/68   Pulse 60   Ht 5' 9.5" (1.765 m)   Wt 211 lb (95.7 kg)   BMI 30.71 kg/m   Wt Readings from Last 3 Encounters:  03/10/18 211 lb (95.7 kg)  01/12/18 215 lb (97.5 kg)  12/29/17 209 lb (94.8 kg)    Physical Exam  Constitutional: + Class I obesity, not in acute distress, alert and oriented x3.  + reluctant state of mind, + hard of hearing Eyes: PERRLA, EOMI, no exophthalmos ENT: moist mucous membranes, no thyromegaly, no cervical lymphadenopathy  Musculoskeletal: no gross deformities, strength intact in all four extremities Skin: moist, warm, no rashes Neurological: no tremor with outstretched hands, Deep tendon reflexes normal in all four extremities.     Lab Results  Component Value Date   TSH 2.37 12/29/2017   FREET4 1.2 12/29/2017    Recent Results (from the past 2160 hour(s))  COMPLETE METABOLIC PANEL WITH GFR     Status: Abnormal   Collection Time: 12/29/17  9:39 AM  Result Value Ref Range   Glucose, Bld 113 65 - 139 mg/dL    Comment: .        Non-fasting reference interval .    BUN 13 7 - 25 mg/dL   Creat 1.33 (H) 0.70 - 1.18 mg/dL    Comment: For patients >79 years of age, the reference limit for Creatinine is approximately 13% higher for people identified as African-American. .    GFR, Est Non African American 51 (L) > OR = 60 mL/min/1.22m   GFR, Est African American 59 (L) > OR = 60 mL/min/1.712m  BUN/Creatinine Ratio 10 6 - 22 (calc)   Sodium 141 135 - 146 mmol/L   Potassium 2.7 (LL) 3.5 - 5.3 mmol/L    Comment:  Verified by repeat analysis. .    Chloride 101 98 - 110 mmol/L   CO2 31 20 - 32 mmol/L   Calcium 9.1 8.6 - 10.3 mg/dL   Total Protein 6.6 6.1 - 8.1 g/dL   Albumin 3.9 3.6 - 5.1 g/dL  Globulin 2.7 1.9 - 3.7 g/dL (calc)   AG Ratio 1.4 1.0 - 2.5 (calc)   Total Bilirubin 0.9 0.2 - 1.2 mg/dL   Alkaline phosphatase (APISO) 80 40 - 115 U/L   AST 12 10 - 35 U/L   ALT 11 9 - 46 U/L  Hemoglobin A1c     Status: None   Collection Time: 12/29/17  9:39 AM  Result Value Ref Range   Hgb A1c MFr Bld 5.5 <5.7 % of total Hgb    Comment: For the purpose of screening for the presence of diabetes: . <5.7%       Consistent with the absence of diabetes 5.7-6.4%    Consistent with increased risk for diabetes             (prediabetes) > or =6.5%  Consistent with diabetes . This assay result is consistent with a decreased risk of diabetes. . Currently, no consensus exists regarding use of hemoglobin A1c for diagnosis of diabetes in children. . According to American Diabetes Association (ADA) guidelines, hemoglobin A1c <7.0% represents optimal control in non-pregnant diabetic patients. Different metrics may apply to specific patient populations.  Standards of Medical Care in Diabetes(ADA). .    Mean Plasma Glucose 111 (calc)   eAG (mmol/L) 6.2 (calc)  TSH     Status: None   Collection Time: 12/29/17  9:39 AM  Result Value Ref Range   TSH 2.37 0.40 - 4.50 mIU/L  T4, free     Status: None   Collection Time: 12/29/17  9:39 AM  Result Value Ref Range   Free T4 1.2 0.8 - 1.8 ng/dL  Magnesium     Status: None   Collection Time: 12/29/17  9:39 AM  Result Value Ref Range   Magnesium 1.6 1.5 - 2.5 mg/dL  Phosphorus     Status: Abnormal   Collection Time: 12/29/17  9:39 AM  Result Value Ref Range   Phosphorus 1.2 (L) 2.1 - 4.3 mg/dL  Basic metabolic panel     Status: Abnormal   Collection Time: 12/29/17  8:09 PM  Result Value Ref Range   Sodium 140 135 - 145 mmol/L   Potassium 3.0 (L) 3.5 -  5.1 mmol/L   Chloride 102 101 - 111 mmol/L   CO2 27 22 - 32 mmol/L   Glucose, Bld 108 (H) 65 - 99 mg/dL   BUN 16 6 - 20 mg/dL   Creatinine, Ser 1.32 (H) 0.61 - 1.24 mg/dL   Calcium 8.6 (L) 8.9 - 10.3 mg/dL   GFR calc non Af Amer 50 (L) >60 mL/min   GFR calc Af Amer 58 (L) >60 mL/min    Comment: (NOTE) The eGFR has been calculated using the CKD EPI equation. This calculation has not been validated in all clinical situations. eGFR's persistently <60 mL/min signify possible Chronic Kidney Disease.    Anion gap 11 5 - 15  CBC with Differential     Status: Abnormal   Collection Time: 12/29/17  8:09 PM  Result Value Ref Range   WBC 11.6 (H) 4.0 - 10.5 K/uL   RBC 4.51 4.22 - 5.81 MIL/uL   Hemoglobin 13.7 13.0 - 17.0 g/dL   HCT 40.2 39.0 - 52.0 %   MCV 89.1 78.0 - 100.0 fL   MCH 30.4 26.0 - 34.0 pg   MCHC 34.1 30.0 - 36.0 g/dL   RDW 13.9 11.5 - 15.5 %   Platelets 236 150 - 400 K/uL   Neutrophils Relative % 65 %  Neutro Abs 7.5 1.7 - 7.7 K/uL   Lymphocytes Relative 26 %   Lymphs Abs 3.0 0.7 - 4.0 K/uL   Monocytes Relative 7 %   Monocytes Absolute 0.9 0.1 - 1.0 K/uL   Eosinophils Relative 2 %   Eosinophils Absolute 0.2 0.0 - 0.7 K/uL   Basophils Relative 0 %   Basophils Absolute 0.1 0.0 - 0.1 K/uL  Magnesium     Status: Abnormal   Collection Time: 12/29/17  8:09 PM  Result Value Ref Range   Magnesium 1.5 (L) 1.7 - 2.4 mg/dL  VITAMIN D 25 Hydroxy (Vit-D Deficiency, Fractures)     Status: Abnormal   Collection Time: 01/12/18  2:44 PM  Result Value Ref Range   Vit D, 25-Hydroxy 8 (L) 30 - 100 ng/mL    Comment: Vitamin D Status         25-OH Vitamin D: . Deficiency:                    <20 ng/mL Insufficiency:             20 - 29 ng/mL Optimal:                 > or = 30 ng/mL . For 25-OH Vitamin D testing on patients on  D2-supplementation and patients for whom quantitation  of D2 and D3 fractions is required, the QuestAssureD(TM) 25-OH VIT D, (D2,D3), LC/MS/MS is  recommended: order  code [id number] (patients >74yr). . For more information on this test, go to: http://education.questdiagnostics.com/faq/FAQ163 (This link is being provided for  informational/educational purposes only.)   PTH, intact and calcium     Status: Abnormal   Collection Time: 01/12/18  2:44 PM  Result Value Ref Range   PTH 113 (H) 14 - 64 pg/mL    Comment: . Interpretive Guide    Intact PTH           Calcium ------------------    ----------           ------- Normal Parathyroid    Normal               Normal Hypoparathyroidism    Low or Low Normal    Low Hyperparathyroidism    Primary            Normal or High       High    Secondary          High                 Normal or Low    Tertiary           High                 High Non-Parathyroid    Hypercalcemia      Low or Low Normal    High .    Calcium 9.4 8.6 - 10.3 mg/dL  COMPLETE METABOLIC PANEL WITH GFR     Status: Abnormal   Collection Time: 03/07/18  8:31 AM  Result Value Ref Range   Glucose, Bld 109 (H) 65 - 99 mg/dL    Comment: .            Fasting reference interval . For someone without known diabetes, a glucose value between 100 and 125 mg/dL is consistent with prediabetes and should be confirmed with a follow-up test. .    BUN 17 7 - 25 mg/dL   Creat 1.21 (H) 0.70 - 1.18 mg/dL  Comment: For patients >53 years of age, the reference limit for Creatinine is approximately 13% higher for people identified as African-American. .    GFR, Est Non African American 57 (L) > OR = 60 mL/min/1.14m   GFR, Est African American 66 > OR = 60 mL/min/1.775m  BUN/Creatinine Ratio 14 6 - 22 (calc)   Sodium 140 135 - 146 mmol/L   Potassium 3.5 3.5 - 5.3 mmol/L   Chloride 105 98 - 110 mmol/L   CO2 31 20 - 32 mmol/L   Calcium 9.1 8.6 - 10.3 mg/dL   Total Protein 6.6 6.1 - 8.1 g/dL   Albumin 4.1 3.6 - 5.1 g/dL   Globulin 2.5 1.9 - 3.7 g/dL (calc)   AG Ratio 1.6 1.0 - 2.5 (calc)   Total Bilirubin 0.9 0.2 - 1.2 mg/dL    Alkaline phosphatase (APISO) 86 40 - 115 U/L   AST 14 10 - 35 U/L   ALT 11 9 - 46 U/L  Magnesium     Status: None   Collection Time: 03/07/18  8:31 AM  Result Value Ref Range   Magnesium 1.6 1.5 - 2.5 mg/dL  Phosphorus     Status: None   Collection Time: 03/07/18  8:31 AM  Result Value Ref Range   Phosphorus 2.5 2.1 - 4.3 mg/dL      labs from 11/23/2017 showed potassium 2.9, normal renal function. Labs from 11/04/2017 showed potassium 2.9, normal renal function.    Assessment & Plan:   1. Hypokalemia-improving 2.  Hypomagnesemia 3. hypophosphatemia 4.  hypocalcemia   His repeat labs show significant improvement in his  electrolyte deficiencies.  - He does not display any symptoms or signs of hypokalemia. -  Etiology of his multiple electrolyte deficiencies is not determined, no clear risk factors for electrolytes loss such as diarrhea, vomiting, or chronic diuretic use. - From his history and physical exam, no indication for distributive hypokalemia such as ileus or third spacing. -Aldosterone and renin ratio is still in process.  -I advised him to continue with supplements of multiple electrolytes including magnesium, calcium, phosphorus, potassium, and vitamin D.  - He does not have family history of electrolyte abnormalities, hence the rare etiologies of hypokalemia  Such as Gitelman's and Bartter syndrome are unlikely at this time.   - I advised patient to maintain close follow up with FaAsencion NobleMD for primary care needs.  Follow up plan: Return in about 3 months (around 06/10/2018) for follow up with pre-visit labs.   GeGlade LloydMD [facility] [place] [id number]: [phone]: [phone]   03/10/2018, 1:23 PM  This note was partially dictated with voice recognition software. Similar sounding words can be transcribed inadequately or may not  be corrected upon  review.

## 2018-03-11 LAB — COMPLETE METABOLIC PANEL WITH GFR
AG RATIO: 1.6 (calc) (ref 1.0–2.5)
ALBUMIN MSPROF: 4.1 g/dL (ref 3.6–5.1)
ALT: 11 U/L (ref 9–46)
AST: 14 U/L (ref 10–35)
Alkaline phosphatase (APISO): 86 U/L (ref 40–115)
BILIRUBIN TOTAL: 0.9 mg/dL (ref 0.2–1.2)
BUN / CREAT RATIO: 14 (calc) (ref 6–22)
BUN: 17 mg/dL (ref 7–25)
CHLORIDE: 105 mmol/L (ref 98–110)
CO2: 31 mmol/L (ref 20–32)
Calcium: 9.1 mg/dL (ref 8.6–10.3)
Creat: 1.21 mg/dL — ABNORMAL HIGH (ref 0.70–1.18)
GFR, EST AFRICAN AMERICAN: 66 mL/min/{1.73_m2} (ref >=60)
GFR, Est Non African American: 57 mL/min/{1.73_m2} — ABNORMAL LOW (ref >=60)
GLOBULIN: 2.5 g/dL (ref 1.9–3.7)
GLUCOSE: 109 mg/dL — AB (ref 65–99)
Potassium: 3.5 mmol/L (ref 3.5–5.3)
Sodium: 140 mmol/L (ref 135–146)
TOTAL PROTEIN: 6.6 g/dL (ref 6.1–8.1)

## 2018-03-11 LAB — MAGNESIUM: Magnesium: 1.6 mg/dL (ref 1.5–2.5)

## 2018-03-11 LAB — PHOSPHORUS: Phosphorus: 2.5 mg/dL (ref 2.1–4.3)

## 2018-03-11 LAB — ALDOSTERONE + RENIN ACTIVITY W/ RATIO
ALDO / PRA Ratio: 216.7 Ratio — ABNORMAL HIGH (ref 0.9–28.9)
Aldosterone: 13 ng/dL
Renin Activity: 0.06 ng/mL/h — ABNORMAL LOW (ref 0.25–5.82)

## 2018-05-31 DIAGNOSIS — E876 Hypokalemia: Secondary | ICD-10-CM | POA: Diagnosis not present

## 2018-05-31 DIAGNOSIS — I10 Essential (primary) hypertension: Secondary | ICD-10-CM | POA: Diagnosis not present

## 2018-06-06 DIAGNOSIS — R972 Elevated prostate specific antigen [PSA]: Secondary | ICD-10-CM | POA: Diagnosis not present

## 2018-06-06 DIAGNOSIS — C61 Malignant neoplasm of prostate: Secondary | ICD-10-CM | POA: Diagnosis not present

## 2018-06-21 ENCOUNTER — Ambulatory Visit: Payer: Medicare Other | Admitting: "Endocrinology

## 2018-07-07 DIAGNOSIS — R739 Hyperglycemia, unspecified: Secondary | ICD-10-CM | POA: Diagnosis not present

## 2018-07-08 LAB — PHOSPHORUS: Phosphorus: 2.8 mg/dL (ref 2.1–4.3)

## 2018-07-08 LAB — VITAMIN D 25 HYDROXY (VIT D DEFICIENCY, FRACTURES): Vit D, 25-Hydroxy: 32 ng/mL (ref 30–100)

## 2018-07-08 LAB — HEMOGLOBIN A1C
EAG (MMOL/L): 6.5 (calc)
HEMOGLOBIN A1C: 5.7 %{Hb} — AB (ref <5.7)
Mean Plasma Glucose: 117 (calc)

## 2018-07-08 LAB — PTH, INTACT AND CALCIUM
CALCIUM: 9.3 mg/dL (ref 8.6–10.3)
PTH: 44 pg/mL (ref 14–64)

## 2018-07-08 LAB — MAGNESIUM: Magnesium: 1.8 mg/dL (ref 1.5–2.5)

## 2018-07-14 ENCOUNTER — Encounter: Payer: Self-pay | Admitting: "Endocrinology

## 2018-07-14 ENCOUNTER — Ambulatory Visit (INDEPENDENT_AMBULATORY_CARE_PROVIDER_SITE_OTHER): Payer: Medicare Other | Admitting: "Endocrinology

## 2018-07-14 VITALS — BP 131/78 | HR 52 | Ht 69.5 in | Wt 211.0 lb

## 2018-07-14 DIAGNOSIS — R7303 Prediabetes: Secondary | ICD-10-CM | POA: Diagnosis not present

## 2018-07-14 DIAGNOSIS — E559 Vitamin D deficiency, unspecified: Secondary | ICD-10-CM

## 2018-07-14 DIAGNOSIS — E876 Hypokalemia: Secondary | ICD-10-CM | POA: Diagnosis not present

## 2018-07-14 NOTE — Progress Notes (Signed)
Endocrinology Follow up Note                                            07/14/2018, 5:38 PM   Subjective:    Patient ID: William Gallagher, male    DOB: Nov 04, 1941, PCP Asencion Noble, MD   Past Medical History:  Diagnosis Date  . Allergy   . Arthritis    right hand fingers  . Chronic hypokalemia   . HOH (hard of hearing)    right ear tinitis  . Hx of radiation therapy 07/04/14- 08/22/14   prostate bed 6600 cGy in 33 sessions  . Prostate cancer ([place]) 04/19/2001   Gleason 6   Past Surgical History:  Procedure Laterality Date  . cataract surgery Bilateral    4-5 years ago  . PROSTATE BIOPSY  04/19/2001   adenocarcinoma   Social History   Socioeconomic History  . Marital status: Married    Spouse name: Not on file  . Number of children: Not on file  . Years of education: Not on file  . Highest education level: Not on file  Occupational History  . Not on file  Social Needs  . Financial resource strain: Not on file  . Food insecurity:    Worry: Not on file    Inability: Not on file  . Transportation needs:    Medical: Not on file    Non-medical: Not on file  Tobacco Use  . Smoking status: Never Smoker  . Smokeless tobacco: Never Used  Substance and Sexual Activity  . Alcohol use: No  . Drug use: No  . Sexual activity: Not on file  Lifestyle  . Physical activity:    Days per week: Not on file    Minutes per session: Not on file  . Stress: Not on file  Relationships  . Social connections:    Talks on phone: Not on file    Gets together: Not on file    Attends religious service: Not on file    Active member of club or organization: Not on file    Attends meetings of clubs or organizations: Not on file    Relationship status: Not on file  Other Topics Concern  . Not on file  Social History Narrative  . Not on file   Outpatient Encounter Medications as of 07/14/2018  Medication Sig  . amLODipine (NORVASC) 10 MG tablet Take 10 mg by mouth daily.   . Calcium  Citrate-Vitamin D 315-250 MG-UNIT TABS Take 1 tablet by mouth daily.  . Cholecalciferol (VITAMIN D3) 5000 units CAPS Take 1 capsule (5,000 Units total) by mouth daily.  Marland Kitchen losartan (COZAAR) 100 MG tablet Take 100 mg by mouth daily.   . potassium chloride SA (K-DUR,KLOR-CON) 20 MEQ tablet Take 1 tablet (20 mEq total) by mouth 2 (two) times daily.  . [DISCONTINUED] Calcium-Phosphorus-Vitamin D [phone] MG-MG-UNIT CHEW Chew 1 tablet by mouth daily.  Marland Kitchen aspirin 81 MG tablet Take 81 mg by mouth daily.   . [DISCONTINUED] Magnesium 100 MG TABS Take 1 tablet (100 mg total) by mouth daily with breakfast. (Patient not taking: Reported on 07/14/2018)   No facility-administered encounter medications on file as of 07/14/2018.    ALLERGIES: Allergies  Allergen Reactions  . Tape Other (See Comments)    Adhesive, blistering    VACCINATION STATUS:  There is no immunization  history on file for this patient.  HPI William Gallagher is 77 y.o. male who presents today for follow-up, after he was seen in consultation for hypokalemia. -On subsequent workup he was found to have deficiencies in several important elements including potassium, magnesium, calcium, vitamin D.  -He has responded very well to the supplements prescribed during his previous visits, including normalization of potassium, calcium, magnesium, vitamin D. -He is returning for follow-up visit, has no new complaints.   - He denies muscular weakness, history of cardiac dysfunction or dysrhythmia.  - He denies any family history of potassium problem. - He denies diarrhea, vomiting, or any body fluid loss. He is not on any diuretics. - He is more sedentary in his life with very minimal exertion. He has history of prostate cancer treated in 1960s. -His EKG during his ER visit showed  sinus bradycardia. -He has had stage 3  renal insufficiency which is improving.  His previsit labs show A1c of 5.7% consistent with prediabetes.  Review of  Systems  Constitutional:  + He has a steady weight,  no fatigue, no subjective hyperthermia, no subjective hypothermia Eyes: no blurry vision, no xerophthalmia ENT: no sore throat, no nodules palpated in throat, no dysphagia/odynophagia, no hoarseness Cardiovascular: No chest pain, no palpitations, nor shortness of breath .   Respiratory: no cough, no SOB Gastrointestinal: no Nausea/Vomiting/Diarhhea Musculoskeletal: no muscle/joint aches Skin: no rashes Neurological: no tremors, no numbness, no tingling, no dizziness Psychiatric: no depression, no anxiety  Objective:    BP 131/78   Pulse (!) 52   Ht 5' 9.5" (1.765 m)   Wt 211 lb (95.7 kg)   SpO2 97%   BMI 30.71 kg/m   Wt Readings from Last 3 Encounters:  07/14/18 211 lb (95.7 kg)  03/10/18 211 lb (95.7 kg)  01/12/18 215 lb (97.5 kg)    Physical Exam  Constitutional: + Class 1 obesity, not in acute distress, alert and oriented x3.  + reluctant state of mind, + hard of hearing Eyes: PERRLA, EOMI, no exophthalmos ENT: moist mucous membranes, no thyromegaly, no cervical lymphadenopathy  Musculoskeletal: no gross deformities, strength intact in all four extremities Skin: moist, warm, no rashes Neurological: no tremor with outstretched hands     Lab Results  Component Value Date   TSH 2.37 12/29/2017   FREET4 1.2 12/29/2017    Recent Results (from the past 2160 hour(s))  Magnesium     Status: None   Collection Time: 07/07/18  8:23 AM  Result Value Ref Range   Magnesium 1.8 1.5 - 2.5 mg/dL  Phosphorus     Status: None   Collection Time: 07/07/18  8:23 AM  Result Value Ref Range   Phosphorus 2.8 2.1 - 4.3 mg/dL  VITAMIN D 25 Hydroxy (Vit-D Deficiency, Fractures)     Status: None   Collection Time: 07/07/18  8:23 AM  Result Value Ref Range   Vit D, 25-Hydroxy 32 30 - 100 ng/mL    Comment: Vitamin D Status         25-OH Vitamin D: . Deficiency:                    <20 ng/mL Insufficiency:             20 - 29  ng/mL Optimal:                 > or = 30 ng/mL . For 25-OH Vitamin D testing on patients on  D2-supplementation and patients  for whom quantitation  of D2 and D3 fractions is required, the QuestAssureD(TM) 25-OH VIT D, (D2,D3), LC/MS/MS is recommended: order  code [id number] (patients >18yrs). . For more information on this test, go to: http://education.questdiagnostics.com/faq/FAQ163 (This link is being provided for  informational/educational purposes only.)   PTH, intact and calcium     Status: None   Collection Time: 07/07/18  8:23 AM  Result Value Ref Range   PTH 44 14 - 64 pg/mL    Comment: . Interpretive Guide    Intact PTH           Calcium ------------------    ----------           ------- Normal Parathyroid    Normal               Normal Hypoparathyroidism    Low or Low Normal    Low Hyperparathyroidism    Primary            Normal or High       High    Secondary          High                 Normal or Low    Tertiary           High                 High Non-Parathyroid    Hypercalcemia      Low or Low Normal    High .    Calcium 9.3 8.6 - 10.3 mg/dL  Hemoglobin A1c     Status: Abnormal   Collection Time: 07/07/18  8:23 AM  Result Value Ref Range   Hgb A1c MFr Bld 5.7 (H) <5.7 % of total Hgb    Comment: For someone without known diabetes, a hemoglobin  A1c value between 5.7% and 6.4% is consistent with prediabetes and should be confirmed with a  follow-up test. . For someone with known diabetes, a value <7% indicates that their diabetes is well controlled. A1c targets should be individualized based on duration of diabetes, age, comorbid conditions, and other considerations. . This assay result is consistent with an increased risk of diabetes. . Currently, no consensus exists regarding use of hemoglobin A1c for diagnosis of diabetes for children. .    Mean Plasma Glucose 117 (calc)   eAG (mmol/L) 6.5 (calc)      labs from 11/23/2017 showed potassium 2.9,  normal renal function. Labs from 11/04/2017 showed potassium 2.9, normal renal function.    Assessment & Plan:   1. Hypokalemia-improved 2.  Hypomagnesemia-  Corrected  3. Hypophosphatemia - treated 4.  Hypocalcemia- corrected    His repeat labs show significant improvement in his  electrolyte deficiencies.  - He does not display any symptoms or signs of hypokalemia. -  Etiology of his multiple electrolyte deficiencies is not determined, no clear risk factors for electrolytes loss such as diarrhea, vomiting, or chronic diuretic use. - From his history and physical exam, no indication for distributive hypokalemia such as ileus or third spacing. -His previous PAC/PRA ratio was significantly elevated at 2016, however his potassium was still low and was being corrected.  This test needs to be repeated to rule out primary aldosteronism.   -I advised him to continue with supplements with calcium, vitamin D, and potassium.  He was taken off of magnesium as well as phosphorus supplements.    - He does not have family history of electrolyte abnormalities,  hence the rare etiologies of hypokalemia  Such as Gitelman's and Bartter syndrome are unlikely at this time.  Prediabetes with A1c of 5.7% -He will not require medication at this time. -  Suggestion is made for him to avoid simple carbohydrates  from his diet including Cakes, Sweet Desserts / Pastries, Ice Cream, Soda (diet and regular), Sweet Tea, Candies, Chips, Cookies, Store Bought Juices, Alcohol in Excess of  1-2 drinks a day, Artificial Sweeteners, and "Sugar-free" Products. This will help patient to have stable blood glucose profile and potentially avoid unintended weight gain.  - I advised patient to maintain close follow up with Asencion Noble, MD for primary care needs.  Follow up plan: Return in about 2 months (around 09/15/2018), or Labs in the Morning before 8AM, for Follow up with Pre-visit Labs.   Glade Lloyd, MD [facility] [address] [place], [place] [address] Phone: [phone]  Fax: [phone]     07/14/2018, 5:38 PM  This note was partially dictated with voice recognition software. Similar sounding words can be transcribed inadequately or may not  be corrected upon review.

## 2018-07-14 NOTE — Patient Instructions (Signed)

## 2018-08-08 DIAGNOSIS — H35033 Hypertensive retinopathy, bilateral: Secondary | ICD-10-CM | POA: Diagnosis not present

## 2018-08-08 DIAGNOSIS — H524 Presbyopia: Secondary | ICD-10-CM | POA: Diagnosis not present

## 2018-09-13 ENCOUNTER — Ambulatory Visit: Payer: Medicare Other | Admitting: "Endocrinology

## 2018-10-17 DIAGNOSIS — Z85820 Personal history of malignant melanoma of skin: Secondary | ICD-10-CM | POA: Diagnosis not present

## 2018-10-17 DIAGNOSIS — L570 Actinic keratosis: Secondary | ICD-10-CM | POA: Diagnosis not present

## 2018-10-17 DIAGNOSIS — D225 Melanocytic nevi of trunk: Secondary | ICD-10-CM | POA: Diagnosis not present

## 2018-10-17 DIAGNOSIS — B078 Other viral warts: Secondary | ICD-10-CM | POA: Diagnosis not present

## 2018-10-17 DIAGNOSIS — Z1283 Encounter for screening for malignant neoplasm of skin: Secondary | ICD-10-CM | POA: Diagnosis not present

## 2018-10-17 DIAGNOSIS — Z08 Encounter for follow-up examination after completed treatment for malignant neoplasm: Secondary | ICD-10-CM | POA: Diagnosis not present

## 2018-11-28 DIAGNOSIS — I951 Orthostatic hypotension: Secondary | ICD-10-CM | POA: Diagnosis not present

## 2018-11-28 DIAGNOSIS — I10 Essential (primary) hypertension: Secondary | ICD-10-CM | POA: Diagnosis not present

## 2018-11-28 DIAGNOSIS — Z79899 Other long term (current) drug therapy: Secondary | ICD-10-CM | POA: Diagnosis not present

## 2018-11-28 DIAGNOSIS — E876 Hypokalemia: Secondary | ICD-10-CM | POA: Diagnosis not present

## 2018-12-29 DIAGNOSIS — G44221 Chronic tension-type headache, intractable: Secondary | ICD-10-CM | POA: Diagnosis not present

## 2018-12-29 DIAGNOSIS — I951 Orthostatic hypotension: Secondary | ICD-10-CM | POA: Diagnosis not present

## 2018-12-29 DIAGNOSIS — R51 Headache: Secondary | ICD-10-CM | POA: Diagnosis not present

## 2018-12-30 ENCOUNTER — Other Ambulatory Visit (HOSPITAL_COMMUNITY): Payer: Self-pay | Admitting: Internal Medicine

## 2018-12-30 DIAGNOSIS — R519 Headache, unspecified: Secondary | ICD-10-CM

## 2018-12-30 DIAGNOSIS — W19XXXA Unspecified fall, initial encounter: Secondary | ICD-10-CM

## 2018-12-30 DIAGNOSIS — R42 Dizziness and giddiness: Secondary | ICD-10-CM

## 2018-12-30 DIAGNOSIS — R51 Headache: Principal | ICD-10-CM

## 2019-01-03 ENCOUNTER — Ambulatory Visit (HOSPITAL_COMMUNITY)
Admission: RE | Admit: 2019-01-03 | Discharge: 2019-01-03 | Disposition: A | Discharge status: Home / Self Care | Payer: Medicare Other | Source: Ambulatory Visit | Attending: Internal Medicine | Admitting: Internal Medicine

## 2019-01-03 DIAGNOSIS — I739 Peripheral vascular disease, unspecified: Secondary | ICD-10-CM | POA: Insufficient documentation

## 2019-01-03 DIAGNOSIS — R519 Headache, unspecified: Secondary | ICD-10-CM

## 2019-01-03 DIAGNOSIS — R42 Dizziness and giddiness: Secondary | ICD-10-CM | POA: Diagnosis not present

## 2019-01-03 DIAGNOSIS — R51 Headache: Secondary | ICD-10-CM | POA: Insufficient documentation

## 2019-01-03 DIAGNOSIS — W19XXXA Unspecified fall, initial encounter: Secondary | ICD-10-CM | POA: Insufficient documentation

## 2019-01-05 DIAGNOSIS — M542 Cervicalgia: Secondary | ICD-10-CM | POA: Diagnosis not present

## 2019-01-05 DIAGNOSIS — R51 Headache: Secondary | ICD-10-CM | POA: Diagnosis not present

## 2019-01-06 ENCOUNTER — Ambulatory Visit (HOSPITAL_COMMUNITY)
Admission: RE | Admit: 2019-01-06 | Discharge: 2019-01-06 | Disposition: A | Discharge status: Home / Self Care | Payer: Medicare Other | Source: Ambulatory Visit | Attending: Internal Medicine | Admitting: Internal Medicine

## 2019-01-06 ENCOUNTER — Other Ambulatory Visit (HOSPITAL_COMMUNITY): Payer: Self-pay | Admitting: Internal Medicine

## 2019-01-06 DIAGNOSIS — M542 Cervicalgia: Secondary | ICD-10-CM

## 2019-01-27 ENCOUNTER — Other Ambulatory Visit (HOSPITAL_COMMUNITY): Payer: Self-pay | Admitting: Internal Medicine

## 2019-01-27 ENCOUNTER — Other Ambulatory Visit: Payer: Self-pay | Admitting: Internal Medicine

## 2019-01-27 DIAGNOSIS — G4452 New daily persistent headache (NDPH): Secondary | ICD-10-CM

## 2019-01-27 DIAGNOSIS — M542 Cervicalgia: Secondary | ICD-10-CM

## 2019-01-30 DIAGNOSIS — I951 Orthostatic hypotension: Secondary | ICD-10-CM | POA: Diagnosis not present

## 2019-01-30 DIAGNOSIS — R51 Headache: Secondary | ICD-10-CM | POA: Diagnosis not present

## 2019-01-31 DIAGNOSIS — H35031 Hypertensive retinopathy, right eye: Secondary | ICD-10-CM | POA: Diagnosis not present

## 2019-01-31 DIAGNOSIS — H40021 Open angle with borderline findings, high risk, right eye: Secondary | ICD-10-CM | POA: Diagnosis not present

## 2019-01-31 DIAGNOSIS — Z961 Presence of intraocular lens: Secondary | ICD-10-CM | POA: Diagnosis not present

## 2019-01-31 DIAGNOSIS — R51 Headache: Secondary | ICD-10-CM | POA: Diagnosis not present

## 2019-02-02 ENCOUNTER — Ambulatory Visit (HOSPITAL_COMMUNITY)
Admission: RE | Admit: 2019-02-02 | Discharge: 2019-02-02 | Disposition: A | Discharge status: Home / Self Care | Payer: Medicare Other | Source: Ambulatory Visit | Attending: Internal Medicine | Admitting: Internal Medicine

## 2019-02-02 DIAGNOSIS — M542 Cervicalgia: Secondary | ICD-10-CM | POA: Insufficient documentation

## 2019-02-02 DIAGNOSIS — G4452 New daily persistent headache (NDPH): Secondary | ICD-10-CM | POA: Diagnosis not present

## 2019-02-07 DIAGNOSIS — R51 Headache: Secondary | ICD-10-CM | POA: Diagnosis not present

## 2019-03-03 ENCOUNTER — Other Ambulatory Visit: Payer: Self-pay | Admitting: "Endocrinology

## 2019-04-03 DIAGNOSIS — H40021 Open angle with borderline findings, high risk, right eye: Secondary | ICD-10-CM | POA: Diagnosis not present

## 2019-04-04 ENCOUNTER — Ambulatory Visit: Payer: Medicare Other | Admitting: Neurology

## 2019-04-19 DIAGNOSIS — E876 Hypokalemia: Secondary | ICD-10-CM | POA: Diagnosis not present

## 2019-04-19 DIAGNOSIS — R413 Other amnesia: Secondary | ICD-10-CM | POA: Diagnosis not present

## 2019-05-03 DIAGNOSIS — H40021 Open angle with borderline findings, high risk, right eye: Secondary | ICD-10-CM | POA: Diagnosis not present

## 2019-05-23 ENCOUNTER — Encounter: Payer: Self-pay | Admitting: *Deleted

## 2019-05-23 ENCOUNTER — Telehealth: Payer: Self-pay | Admitting: *Deleted

## 2019-05-23 NOTE — Telephone Encounter (Signed)
Called pt and discussed appointment tomorrow. He has no means of doing a virtual visit. He stated he feels comfortable coming into the office. Pt aware of the requirement to wear a mask. He will bring his own. He also is aware of the check-in process by driving up to the front. He stated he will need to have someone with him d/t his hearing. Pt passed all COVID19 screening questions today and is aware that his guest will need to pass the questions too. Pt aware temp will be taken upon arrival. He understands to check-in at 8:30 AM tomorrow. He verbalized appreciation for the call.   COVID19 risk score medium (5). Per last note from oncology, pt doing well.

## 2019-05-23 NOTE — Telephone Encounter (Signed)
I tried to call the patient twice. Phone rang multiple times both attempts until a vm answered and said to enter remote access code. No ability to LVM. Call ended from other line. Will try again later this afternoon. If unable to reach patient will cancel appt and patient will need to r/s.

## 2019-05-24 ENCOUNTER — Encounter: Payer: Self-pay | Admitting: Neurology

## 2019-05-24 ENCOUNTER — Ambulatory Visit: Payer: Medicare Other | Admitting: Neurology

## 2019-05-24 ENCOUNTER — Other Ambulatory Visit: Payer: Self-pay

## 2019-05-24 VITALS — BP 179/106 | HR 73 | Temp 98.0°F | Ht 69.0 in | Wt 211.0 lb

## 2019-05-24 DIAGNOSIS — R410 Disorientation, unspecified: Secondary | ICD-10-CM | POA: Diagnosis not present

## 2019-05-24 DIAGNOSIS — R443 Hallucinations, unspecified: Secondary | ICD-10-CM

## 2019-05-24 DIAGNOSIS — G441 Vascular headache, not elsewhere classified: Secondary | ICD-10-CM | POA: Diagnosis not present

## 2019-05-24 DIAGNOSIS — G3183 Dementia with Lewy bodies: Secondary | ICD-10-CM

## 2019-05-24 DIAGNOSIS — F0281 Dementia in other diseases classified elsewhere with behavioral disturbance: Secondary | ICD-10-CM

## 2019-05-24 DIAGNOSIS — R413 Other amnesia: Secondary | ICD-10-CM

## 2019-05-24 DIAGNOSIS — F02818 Dementia in other diseases classified elsewhere, unspecified severity, with other behavioral disturbance: Secondary | ICD-10-CM

## 2019-05-24 MED ORDER — DONEPEZIL HCL 5 MG PO TABS: 5.0000 mg | ORAL_TABLET | Freq: Every day | ORAL | 11 refills | Status: AC

## 2019-05-24 NOTE — Progress Notes (Signed)
[facility]    Provider:  Dr Jaynee Eagles Requesting Provider: Asencion Noble, MD Primary Care Provider:  Asencion Noble, MD  CC:  Memory changes, hallucinations, confusion  HPI:  William Gallagher is a 78 y.o. male here as requested by Asencion Noble, MD for headaches. Here with son. PMHx HTN, prostate cancer, headache, arthritis, anxiety.   However he says his headaches have resolved and they would like to discuss other issues. Started with headaches 3 months ago. He started having headaches daily, bad headaches. He took some "pain pills" and resolved the headaches, had a steroid taper and the headaches resolved and never came back. After the headache went away he feels dizzy, he stumbles and staggers, loss of vision and depth perception, confusion. Patient is a poor historian. He goes to the fridge to get something and forgets what. He doesn't talk a lot, this is not knew. Having difficulty operating the TV remote.   Dizzy: feels lightheaded like he is going to pass out. He stopped his blood pressure medications this did not help. It is on change of position. That's when he stumbles. He can also get dizzy walking.   Eyes: He has been losing vision. He went to the regular eye doctor. He went to a specialist. Worse in the right eye but vision loss in both eyes. He had a blood clot behind the eye. He had glaucoma.   Confusion and hallucinations: He will see little boy running through the house. He will see a rat running. Unclear how long it has been going on. He is losing his memory. No Fhx of dementia. He shuffles, difficulty walking,   Gait abnormality: He moves slower than he used to.   Sleep: He has swung at people and fallen out of the bed.   Reviewed notes, labs and imaging from outside physicians, which showed:  MRI brain 01/03/2019: Chronic small vessel disease and generalized volume loss without acute intracranial abnormality.personally reviewed images.  MRI cervical spine:  personally reviewed images and agree  IMPRESSION: 1. Advanced degenerative foraminal impingement bilaterally at C3-4 and on the right at C5-6. 2. Diffusely patent spinal canal.  Review of Systems: Patient complains of symptoms per HPI as well as the following symptoms confusion, memory loss. Pertinent negatives and positives per HPI. All others negative.   Social History   Socioeconomic History  . Marital status: Married    Spouse name: Not on file  . Number of children: 4  . Years of education: [employer]  . Highest education level: Some college, no degree  Occupational History  . Not on file  Social Needs  . Financial resource strain: Not on file  . Food insecurity:    Worry: Not on file    Inability: Not on file  . Transportation needs:    Medical: Not on file    Non-medical: Not on file  Tobacco Use  . Smoking status: Never Smoker  . Smokeless tobacco: Never Used  Substance and Sexual Activity  . Alcohol use: No  . Drug use: No  . Sexual activity: Not on file  Lifestyle  . Physical activity:    Days per week: Not on file    Minutes per session: Not on file  . Stress: Not on file  Relationships  . Social connections:    Talks on phone: Not on file    Gets together: Not on file    Attends religious service: Not on file    Active member of club or  organization: Not on file    Attends meetings of clubs or organizations: Not on file    Relationship status: Not on file  . Intimate partner violence:    Fear of current or ex partner: Not on file    Emotionally abused: Not on file    Physically abused: Not on file    Forced sexual activity: Not on file  Other Topics Concern  . Not on file  Social History Narrative   Lives at home with wife   Right handed   Caffeine: 1 cup of coffee a day    Family History  Problem Relation Age of Onset  . Cancer Mother        on neck  . Kidney failure Father   . Cancer Paternal Uncle        unknown  . Headache  Son     Past Medical History:  Diagnosis Date  . Allergy   . Arthritis    right hand fingers  . Chronic hypokalemia   . Headache   . HOH (hard of hearing)    right ear tinitis  . Hx of radiation therapy 07/04/14- 08/22/14   prostate bed 6600 cGy in 33 sessions  . Hypertension   . Prostate cancer ([place]) 04/19/2001   Gleason 6    Patient Active Problem List   Diagnosis Date Noted  . Prediabetes 07/14/2018  . Vitamin D deficiency 07/14/2018  . Hypokalemia 01/12/2018  . Hypocalcemia 01/12/2018  . Hypophosphatemia 01/12/2018  . Hypomagnesemia 01/12/2018  . Malignant neoplasm of prostate ([place]) 06/19/2014    Past Surgical History:  Procedure Laterality Date  . cataract surgery Bilateral    4-5 years ago  . COLONOSCOPY    . PROSTATE BIOPSY  04/19/2001   adenocarcinoma  . PROSTATECTOMY  2002    Current Outpatient Medications  Medication Sig Dispense Refill  . Cholecalciferol (VITAMIN D3 PO) Take 1,000 Int'l Units by mouth daily.    . cyanocobalamin 1000 MCG tablet Take 1,000 mcg by mouth daily.    . Multiple Minerals-Vitamins (CITRACAL MAXIMUM PLUS PO) Take 1 tablet by mouth daily.    . potassium chloride SA (K-DUR,KLOR-CON) 20 MEQ tablet Take 1 tablet (20 mEq total) by mouth 2 (two) times daily. 60 tablet 2  . amLODipine (NORVASC) 10 MG tablet Take 10 mg by mouth daily.     Marland Kitchen donepezil (ARICEPT) 5 MG tablet Take 1 tablet (5 mg total) by mouth at bedtime. 30 tablet 11  . losartan (COZAAR) 100 MG tablet Take 100 mg by mouth daily.      No current facility-administered medications for this visit.     Allergies as of 05/24/2019 - Review Complete 05/24/2019  Allergen Reaction Noted  . Other  05/23/2019  . Tape Other (See Comments) 06/12/2014    Vitals: BP (!) 179/106 (BP Location: Right Arm, Patient Position: Sitting)   Pulse 73   Temp 98 F (36.7 C) Comment: son's temp 97.8  Ht 5' 9" (1.753 m)   Wt 211 lb (95.7 kg)   BMI 31.16 kg/m  Last Weight:  Wt Readings from Last  1 Encounters:  05/24/19 211 lb (95.7 kg)   Last Height:   Ht Readings from Last 1 Encounters:  05/24/19 5' 9" (1.753 m)     Physical exam: Exam: Gen: NAD, conversant,                 CV: RRR, no MRG. No Carotid Bruits. No peripheral edema, warm, nontender Eyes: Conjunctivae clear  without exudates or hemorrhage  Neuro: Detailed Neurologic Exam  Speech:    Speech is normal; fluent and spontaneous with normal comprehension.   MMSE - Mini Mental State Exam 05/24/2019  Orientation to time 4  Orientation to Place 4  Registration 3  Attention/ Calculation 2  Recall 2  Language- name 2 objects 2  Language- repeat 0  Language- follow 3 step command 3  Language- read & follow direction 1  Write a sentence 1  Copy design 0  Total score 22    Cognition: forgot president's name, knows month and day and year.    The patient is oriented to person, place, and time;     recent and remote memory impaired;     language fluent;     Impaired attention, concentration,  fund of knowledge Cranial Nerves: hypomimia    The pupils are equal, round, and reactive to light. Could not visualize fundi due to small pupils.  Visual fields are full to finger confrontation. Extraocular movements are intact. Trigeminal sensation is intact and the muscles of mastication are normal. The face is symmetric. The palate elevates in the midline. Hearing impaired. Voice is normal. Shoulder shrug is normal. The tongue has normal motion without fasciculations.   Negative frontal release signs.   Coordination:    Normal finger to nose.   Gait: Can stand without using hands. Decreased right arm swing. Imbalance and shortened strides with en-bloc turning.  Bradykinesia but not classically shuffling.     Motor Observation:    No asymmetry, no atrophy, and no involuntary movements noted. Tone:    Normal muscle tone.    Posture:    Posture is normal. normal erect    Strength:    Strength is V/V in the upper  and lower limbs.      Sensation: intact to LT     Reflex Exam:  DTR's: hyporeflexive lowers, uppers 2+   Toes:    The toes are downgoing bilaterally.   Clonus:    Clonus is absent.    Assessment/Plan:  MILBERT BIXLER is a 78 y.o. male here as requested by Asencion Noble, MD for headaches. Here with son. PMHx HTN, prostate cancer, headache, arthritis, anxiety.   However he says his headaches have resolved and they would like to discuss other issues.  Patient with multiple symptoms including imbalance, walking slowly, acting out his dreams, dizziness, confusion and cognitive deficits, gait abnormality with bradykinesia, hallucinations.  He has some parkinsonian signs on exam including hypomania, bradykinesia, shortened strides but not classically shuffling, decreased arm swing, and imbalance.  He is also significantly orthostatic today and his MMSE is 22 out of 30.  - Given symptoms above, I am concerned for a Lewy body dementia.  He sees small children running around, this is a very common hallucination in Lewy body dementia.  We will send to [place] for formal neurocognitive testing.  - We will order blood work and urine testing today.  - He is significantly orthostatic today, 188/106 sitting and 137/87 standing.  His urine is dark.  Likely a combination of dehydration and possibly autonomic instability from a parkinsonian disorder such as Lewy body dementia.  - Will start aricept  - will schedule follow up after neurocog testing completed  -Headache resolved but will check ESR and CRP given his reported visual disturbances  Orders Placed This Encounter  Procedures  . Culture, Urine  . RPR  . HIV Antibody (routine testing w rflx)  . B12 and Folate  Panel  . Methylmalonic acid, serum  . Vitamin B1  . CBC  . Comprehensive metabolic panel  . TSH  . Homocysteine  . Urinalysis, Routine w reflex microscopic  . Drug Screen, Urine([facility])  . Sedimentation rate  . C-reactive protein   . Ambulatory referral to Neuropsychology   Meds ordered this encounter  Medications  . donepezil (ARICEPT) 5 MG tablet    Sig: Take 1 tablet (5 mg total) by mouth at bedtime.    Dispense:  30 tablet    Refill:  11    Cc: Asencion Noble, MD,    Sarina Ill, MD  [facility] [address] [place],  [address]  Phone [phone] Fax [phone]

## 2019-05-24 NOTE — Patient Instructions (Addendum)
Bloodwork today Formal memory testing - Dr. Sima Matas Aricept 5mg  call me in a few weeks or a month and can increase it Orthostatic hypotension - blood pressure dropped when standing can cause dizziness, watch fluid intake   Orthostatic Hypotension Blood pressure is a measurement of how strongly, or weakly, your blood is pressing against the walls of your arteries. Orthostatic hypotension is a sudden drop in blood pressure that happens when you quickly change positions, such as when you get up from sitting or lying down. Arteries are blood vessels that carry blood from your heart throughout your body. When blood pressure is too low, you may not get enough blood to your brain or to the rest of your organs. This can cause weakness, light-headedness, rapid heartbeat, and fainting. This can last for just a few seconds or for up to a few minutes. Orthostatic hypotension is usually not a serious problem. However, if it happens frequently or gets worse, it may be a sign of something more serious. What are the causes? This condition may be caused by:  Sudden changes in posture, such as standing up quickly after you have been sitting or lying down.  Blood loss.  Loss of body fluids (dehydration).  Heart problems.  Hormone (endocrine) problems.  Pregnancy.  Severe infection.  Lack of certain nutrients.  Severe allergic reactions (anaphylaxis).  Certain medicines, such as blood pressure medicine or medicines that make the body lose excess fluids (diuretics). Sometimes, this condition can be caused by not taking medicine as directed, such as taking too much of a certain medicine. What increases the risk? The following factors may make you more likely to develop this condition:  Age. Risk increases as you get older.  Conditions that affect the heart or the central nervous system.  Taking certain medicines, such as blood pressure medicine or diuretics.  Being pregnant. What are the signs  or symptoms? Symptoms of this condition may include:  Weakness.  Light-headedness.  Dizziness.  Blurred vision.  Fatigue.  Rapid heartbeat.  Fainting, in severe cases. How is this diagnosed? This condition is diagnosed based on:  Your medical history.  Your symptoms.  Your blood pressure measurement. Your health care provider will check your blood pressure when you are: ? Lying down. ? Sitting. ? Standing. A blood pressure reading is recorded as two numbers, such as "120 over 80" (or 120/80). The first ("top") number is called the systolic pressure. It is a measure of the pressure in your arteries as your heart beats. The second ("bottom") number is called the diastolic pressure. It is a measure of the pressure in your arteries when your heart relaxes between beats. Blood pressure is measured in a unit called mm Hg. Healthy blood pressure for most adults is 120/80. If your blood pressure is below 90/60, you may be diagnosed with hypotension. Other information or tests that may be used to diagnose orthostatic hypotension include:  Your other vital signs, such as your heart rate and temperature.  Blood tests.  Tilt table test. For this test, you will be safely secured to a table that moves you from a lying position to an upright position. Your heart rhythm and blood pressure will be monitored during the test. How is this treated? This condition may be treated by:  Changing your diet. This may involve eating more salt (sodium) or drinking more water.  Taking medicines to raise your blood pressure.  Changing the dosage of certain medicines you are taking that might be lowering  your blood pressure.  Wearing compression stockings. These stockings help to prevent blood clots and reduce swelling in your legs. In some cases, you may need to go to the hospital for:  Fluid replacement. This means you will receive fluids through an IV.  Blood replacement. This means you will  receive donated blood through an IV (transfusion).  Treating an infection or heart problems, if this applies.  Monitoring. You may need to be monitored while medicines that you are taking wear off. Follow these instructions at home: Eating and drinking   Drink enough fluid to keep your urine pale yellow.  Eat a healthy diet, and follow instructions from your health care provider about eating or drinking restrictions. A healthy diet includes: ? Fresh fruits and vegetables. ? Whole grains. ? Lean meats. ? Low-fat dairy products.  Eat extra salt only as directed. Do not add extra salt to your diet unless your health care provider told you to do that.  Eat frequent, small meals.  Avoid standing up suddenly after eating. Medicines  Take over-the-counter and prescription medicines only as told by your health care provider. ? Follow instructions from your health care provider about changing the dosage of your current medicines, if this applies. ? Do not stop or adjust any of your medicines on your own. General instructions   Wear compression stockings as told by your health care provider.  Get up slowly from lying down or sitting positions. This gives your blood pressure a chance to adjust.  Avoid hot showers and excessive heat as directed by your health care provider.  Return to your normal activities as told by your health care provider. Ask your health care provider what activities are safe for you.  Do not use any products that contain nicotine or tobacco, such as cigarettes, e-cigarettes, and chewing tobacco. If you need help quitting, ask your health care provider.  Keep all follow-up visits as told by your health care provider. This is important. Contact a health care provider if you:  Vomit.  Have diarrhea.  Have a fever for more than 2-3 days.  Feel more thirsty than usual.  Feel weak and tired. Get help right away if you:  Have chest pain.  Have a fast or  irregular heartbeat.  Develop numbness in any part of your body.  Cannot move your arms or your legs.  Have trouble speaking.  Become sweaty or feel light-headed.  Faint.  Feel short of breath.  Have trouble staying awake.  Feel confused. Summary  Orthostatic hypotension is a sudden drop in blood pressure that happens when you quickly change positions.  Orthostatic hypotension is usually not a serious problem.  It is diagnosed by having your blood pressure taken lying down, sitting, and then standing.  It may be treated by changing your diet or adjusting your medicines. This information is not intended to replace advice given to you by your health care provider. Make sure you discuss any questions you have with your health care provider. Document Released: 11/20/2002 Document Revised: 05/26/2018 Document Reviewed: 05/26/2018 Elsevier Interactive Patient Education  2019 [employer]    Lewy Body Dementia Dementia is the loss of two or more brain functions, such as:  Memory.  Decision making.  Behavior.  Speaking.  Thinking.  Problem solving. Lewy body dementia is a type of dementia that gets worse with time. Lewy body dementia is also called dementia with Lewy bodies. What are the causes? This condition is caused by the buildup of  proteins called Lewy bodies in brain cells. It is not known what causes the Lewy bodies to build up. What are the signs or symptoms? Early symptoms of this condition include:  Seeing things that are not there (hallucinating).  Trouble sleeping.  Loss of smell. Later symptoms include:  Problems with problem solving, abstract thinking, and reasoning.  Memory problems.  Poor judgment.  Confusion.  Reduced attention span.  False ideas about another person or situation (delusions).  Disorganized speech.  Sleep problems, such as acting out dreams.  Shifts in alertness and attention, such as: ? Periods of drowsiness or  lack of energy (lethargy). ? Long periods of time spent staring into space.  Changes in movement. For example: ? Trouble moving. ? Slow movement. ? Poor posture. ? Rigid muscles. ? Shuffling movements (gait). ? Tremors. How is this diagnosed? This condition is diagnosed with an assessment by your health care provider. During this assessment, your health care provider will talk to you and your family, friends, or caregivers about your symptoms. A thorough medical history will be taken, and you will have a physical exam and tests. Tests may include:  Lab tests, such as blood or urine tests.  Imaging tests, such as a CT scan, PET scan, or MRI.  A lumbar puncture. This test involves removing and testing a small amount of the fluid that surrounds the brain and spinal cord.  An electroencephalogram (EEG). In this test, small metal discs are used to measure electrical activity in the brain.  Memory tests, cognitive tests, and neuropsychological tests. These tests evaluate brain function. How is this treated? There is no cure for this condition. Medicines may be prescribed to help slow down how fast the dementia gets worse and to help with symptoms. Follow these instructions at home: Medicine  Take over-the-counter and prescription medicines only as told by your health care provider.  Avoid taking medicines that can affect thinking, such as pain or sleeping medicines. Lifestyle   Make healthy lifestyle choices: ? Be physically active as told by your health care provider. ? Do not use any tobacco products, such as cigarettes, chewing tobacco, and e-cigarettes. If you need help quitting, ask your health care provider. ? Eat a healthy diet. ? Practice stress-management techniques when you get stressed. ? Stay social.  Drink enough fluid to keep your urine clear or pale yellow.  Make sure you sleep well. These tips can help you get a good night's rest: ? Avoid napping during the  day. ? Keep your sleeping area dark and cool. ? Avoid exercising during the few hours before you go to bed. ? Avoid caffeine products in the evening. General instructions  Work with your health care provider to determine what you need help with and what your safety needs are.  If you were given a bracelet that tracks your location, make sure to wear it.  Keep all follow-up visits as told by your health care provider. This is important. Contact a health care provider if:  You have any new symptoms.  You have problems with choking or swallowing.  You have any symptoms of a new or different illness. Get help right away if:  You develop a fever.  You have new or worsening confusion.  You have new or worsening trouble with sleeping.  You have a hard time staying awake.  You or your family members become concerned for your safety. This information is not intended to replace advice given to you by your health care  provider. Make sure you discuss any questions you have with your health care provider. Document Released: 08/22/2002 Document Revised: 01/06/2016 Document Reviewed: 08/28/2015 Elsevier Interactive Patient Education  2019 [employer].   Donepezil tablets What is this medicine? DONEPEZIL (doe NEP e zil) is used to treat mild to moderate dementia caused by Alzheimer's disease. This medicine may be used for other purposes; ask your health care provider or pharmacist if you have questions. COMMON BRAND NAME(S): Aricept What should I tell my health care provider before I take this medicine? They need to know if you have any of these conditions: -asthma or other lung disease -difficulty passing urine -head injury -heart disease -history of irregular heartbeat -liver disease -seizures (convulsions) -stomach or intestinal disease, ulcers or stomach bleeding -an unusual or allergic reaction to donepezil, other medicines, foods, dyes, or preservatives -pregnant or trying  to get pregnant -breast-feeding How should I use this medicine? Take this medicine by mouth with a glass of water. Follow the directions on the prescription label. You may take this medicine with or without food. Take this medicine at regular intervals. This medicine is usually taken before bedtime. Do not take it more often than directed. Continue to take your medicine even if you feel better. Do not stop taking except on your doctor's advice. If you are taking the 23 mg donepezil tablet, swallow it whole; do not cut, crush, or chew it. Talk to your pediatrician regarding the use of this medicine in children. Special care may be needed. Overdosage: If you think you have taken too much of this medicine contact a poison control center or emergency room at once. NOTE: This medicine is only for you. Do not share this medicine with others. What if I miss a dose? If you miss a dose, take it as soon as you can. If it is almost time for your next dose, take only that dose, do not take double or extra doses. What may interact with this medicine? Do not take this medicine with any of the following medications: -certain medicines for fungal infections like itraconazole, fluconazole, posaconazole, and voriconazole -cisapride -dextromethorphan; quinidine -dofetilide -dronedarone -pimozide -quinidine -thioridazine -ziprasidone This medicine may also interact with the following medications: -antihistamines for allergy, cough and cold -atropine -bethanechol -carbamazepine -certain medicines for bladder problems like oxybutynin, tolterodine -certain medicines for Parkinson's disease like benztropine, trihexyphenidyl -certain medicines for stomach problems like dicyclomine, hyoscyamine -certain medicines for travel sickness like scopolamine -dexamethasone -ipratropium -NSAIDs, medicines for pain and inflammation, like ibuprofen or naproxen -other medicines for Alzheimer's disease -other medicines  that prolong the QT interval (cause an abnormal heart rhythm) -phenobarbital -phenytoin -rifampin, rifabutin or rifapentine This list may not describe all possible interactions. Give your health care provider a list of all the medicines, herbs, non-prescription drugs, or dietary supplements you use. Also tell them if you smoke, drink alcohol, or use illegal drugs. Some items may interact with your medicine. What should I watch for while using this medicine? Visit your doctor or health care professional for regular checks on your progress. Check with your doctor or health care professional if your symptoms do not get better or if they get worse. You may get drowsy or dizzy. Do not drive, use machinery, or do anything that needs mental alertness until you know how this drug affects you. What side effects may I notice from receiving this medicine? Side effects that you should report to your doctor or health care professional as soon as possible: -allergic reactions like  skin rash, itching or hives, swelling of the face, lips, or tongue -feeling faint or lightheaded, falls -loss of bladder control -seizures -signs and symptoms of a dangerous change in heartbeat or heart rhythm like chest pain; dizziness; fast or irregular heartbeat; palpitations; feeling faint or lightheaded, falls; breathing problems -signs and symptoms of infection like fever or chills; cough; sore throat; pain or trouble passing urine -signs and symptoms of liver injury like dark yellow or brown urine; general ill feeling or flu-like symptoms; light-colored stools; loss of appetite; nausea; right upper belly pain; unusually weak or tired; yellowing of the eyes or skin -slow heartbeat or palpitations -unusual bleeding or bruising -vomiting Side effects that usually do not require medical attention (report to your doctor or health care professional if they continue or are bothersome): -diarrhea, especially when starting  treatment -headache -loss of appetite -muscle cramps -nausea -stomach upset This list may not describe all possible side effects. Call your doctor for medical advice about side effects. You may report side effects to FDA at 1-800-FDA-1088. Where should I keep my medicine? Keep out of reach of children. Store at room temperature between 15 and 30 degrees C (59 and 86 degrees F). Throw away any unused medicine after the expiration date. NOTE: This sheet is a summary. It may not cover all possible information. If you have questions about this medicine, talk to your doctor, pharmacist, or health care provider.  2019 Elsevier/[employer] (2016-05-18 21:00:42)

## 2019-05-26 LAB — DRUG SCREEN, URINE
Amphetamines, Urine: NEGATIVE ng/mL
Barbiturate screen, urine: NEGATIVE ng/mL
Benzodiazepine Quant, Ur: NEGATIVE ng/mL
Cannabinoid Quant, Ur: NEGATIVE ng/mL
Cocaine (Metab.): NEGATIVE ng/mL
Opiate Quant, Ur: NEGATIVE ng/mL
PCP Quant, Ur: NEGATIVE ng/mL

## 2019-05-26 LAB — URINE CULTURE

## 2019-05-30 LAB — MICROSCOPIC EXAMINATION

## 2019-05-30 LAB — COMPREHENSIVE METABOLIC PANEL
ALT: 13 IU/L (ref 0–44)
AST: 17 IU/L (ref 0–40)
Albumin/Globulin Ratio: 1.5 (ref 1.2–2.2)
Albumin: 4.2 g/dL (ref 3.7–4.7)
Alkaline Phosphatase: 79 IU/L (ref 39–117)
BUN/Creatinine Ratio: 15 (ref 10–24)
BUN: 21 mg/dL (ref 8–27)
Bilirubin Total: 0.6 mg/dL (ref 0.0–1.2)
CO2: 25 mmol/L (ref 20–29)
Calcium: 10.1 mg/dL (ref 8.6–10.2)
Chloride: 102 mmol/L (ref 96–106)
Creatinine, Ser: 1.4 mg/dL — ABNORMAL HIGH (ref 0.76–1.27)
GFR calc Af Amer: 54 mL/min/{1.73_m2} — ABNORMAL LOW (ref >59)
GFR calc non Af Amer: 47 mL/min/{1.73_m2} — ABNORMAL LOW (ref >59)
Globulin, Total: 2.8 g/dL (ref 1.5–4.5)
Glucose: 104 mg/dL — ABNORMAL HIGH (ref 65–99)
Potassium: 4 mmol/L (ref 3.5–5.2)
Sodium: 143 mmol/L (ref 134–144)
Total Protein: 7 g/dL (ref 6.0–8.5)

## 2019-05-30 LAB — CBC
Hematocrit: 45 % (ref 37.5–51.0)
Hemoglobin: 14.5 g/dL (ref 13.0–17.7)
MCH: 30.5 pg (ref 26.6–33.0)
MCHC: 32.2 g/dL (ref 31.5–35.7)
MCV: 95 fL (ref 79–97)
Platelets: 207 10*3/uL (ref 150–450)
RBC: 4.76 x10E6/uL (ref 4.14–5.80)
RDW: 12.8 % (ref 11.6–15.4)
WBC: 9.6 10*3/uL (ref 3.4–10.8)

## 2019-05-30 LAB — URINALYSIS, ROUTINE W REFLEX MICROSCOPIC
Bilirubin, UA: NEGATIVE
Glucose, UA: NEGATIVE
Leukocytes,UA: NEGATIVE
Nitrite, UA: NEGATIVE
RBC, UA: NEGATIVE
Specific Gravity, UA: 1.023 (ref 1.005–1.030)
Urobilinogen, Ur: 0.2 mg/dL (ref 0.2–1.0)
pH, UA: 5.5 (ref 5.0–7.5)

## 2019-05-30 LAB — METHYLMALONIC ACID, SERUM: Methylmalonic Acid: 252 nmol/L (ref 0–378)

## 2019-05-30 LAB — SEDIMENTATION RATE: Sed Rate: 16 mm/hr (ref 0–30)

## 2019-05-30 LAB — RPR: RPR Ser Ql: NONREACTIVE

## 2019-05-30 LAB — VITAMIN B1: Thiamine: 134.3 nmol/L (ref 66.5–200.0)

## 2019-05-30 LAB — HOMOCYSTEINE: Homocysteine: 46.8 umol/L — ABNORMAL HIGH (ref 0.0–19.2)

## 2019-05-30 LAB — TSH: TSH: 1.96 u[IU]/mL (ref 0.450–4.500)

## 2019-05-30 LAB — C-REACTIVE PROTEIN: CRP: <1 mg/L (ref 0–10)

## 2019-05-30 LAB — B12 AND FOLATE PANEL
Folate: 2.8 ng/mL — ABNORMAL LOW (ref >3.0)
Vitamin B-12: 819 pg/mL (ref 232–1245)

## 2019-05-30 LAB — HIV ANTIBODY (ROUTINE TESTING W REFLEX): HIV Screen 4th Generation wRfx: NONREACTIVE

## 2019-05-31 ENCOUNTER — Encounter: Payer: Self-pay | Admitting: Psychology

## 2019-06-07 ENCOUNTER — Telehealth: Payer: Self-pay | Admitting: *Deleted

## 2019-06-07 NOTE — Telephone Encounter (Signed)
I spoke with the pt's son and advised him of the lab results. I discussed Dr. Cathren Laine instructions to get an OTC multi B vitamin to take. Nothing significant found on the labs. He verbalized appreciation and understands to have pt take the supplement daily.   Note pt has a new patient appt with Dr. Sima Matas on 09/07/2019 @ 8:00 AM.

## 2019-06-07 NOTE — Telephone Encounter (Signed)
-----   Message from Melvenia Beam, MD sent at 05/30/2019 10:57 AM EDT ----- Patient has a folate deficiency, and his homocysteine is elevated(an indicator of vitamin B deficiencies) and this can contribute to dementia. He should find an OTC multi-B vitamin supplement and start taking that and we can retest at next appointment. But nothing significant found on labs.

## 2019-06-15 DIAGNOSIS — E876 Hypokalemia: Secondary | ICD-10-CM | POA: Diagnosis not present

## 2019-08-01 DIAGNOSIS — Z961 Presence of intraocular lens: Secondary | ICD-10-CM | POA: Diagnosis not present

## 2019-08-01 DIAGNOSIS — H40021 Open angle with borderline findings, high risk, right eye: Secondary | ICD-10-CM | POA: Diagnosis not present

## 2019-09-07 ENCOUNTER — Encounter: Payer: Medicare Other | Attending: Psychology | Admitting: Psychology

## 2019-09-07 ENCOUNTER — Encounter: Payer: Self-pay | Admitting: Psychology

## 2019-09-07 ENCOUNTER — Other Ambulatory Visit: Payer: Self-pay

## 2019-09-07 DIAGNOSIS — G3183 Dementia with Lewy bodies: Secondary | ICD-10-CM | POA: Diagnosis present

## 2019-09-07 DIAGNOSIS — R413 Other amnesia: Secondary | ICD-10-CM | POA: Diagnosis not present

## 2019-09-07 DIAGNOSIS — R441 Visual hallucinations: Secondary | ICD-10-CM | POA: Diagnosis not present

## 2019-09-07 DIAGNOSIS — F0281 Dementia in other diseases classified elsewhere with behavioral disturbance: Secondary | ICD-10-CM | POA: Insufficient documentation

## 2019-09-07 NOTE — Progress Notes (Signed)
Neuropsychological Consultation   Patient:   William Gallagher   DOB:   06/03/1941  MR Number:  [id number]  Location:  [facility] [place], [place] [id number] [facility] [place] [address] Dept: [phone]           Date of Service:   09/07/2019  Start Time:   8 AM End Time:   10 AM  Today's visit was an in person visit that included myself, the patient and his son.  There was a 1 hour face-to-face clinical interview with myself and the patient/son as well as 1 hour spent with records review and report writing.  Provider/Observer:  Ilean Skill, Psy.D.       Clinical Neuropsychologist       Billing Code/Service: [id number], [phone]  Chief Complaint:    [name] "Talbert Nan" is an 78 year old male who was referred by Dr. Jaynee Eagles for neuropsychological evaluation Dr. Jaynee Eagles is the patient's neurologist.  The patient was initially referred to neurology by Dr. Willey Blade who is his PCP in [place].  The patient has a history of hypertension, prostate cancer, recent development of headaches, arthritis, anxiety, and melanoma.  The patient was initially referred to neurology due to developing significant severe headaches that were not resolved with various pain medications.  The patient was prescribed steroid taper and the headaches resolved and have not returned.  The patient also is having balance and motor function changes, depth perception issues, confusion and loss of vision.  The patient also has hearing loss.  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  The patient also describes visual hallucinations in which "see things that are not there" including various objects or nondescript people.  The patient denies any auditory  hallucination.  Visual hallucinations have included things such as seeing a little boy running to the house or seeing rats running to the house.  Reason for Service:  [name] "Talbert Nan" is an 78 year old male who was referred by Dr. Jaynee Eagles for neuropsychological evaluation Dr. Jaynee Eagles is the patient's neurologist.  The patient was initially referred to neurology by Dr. Willey Blade who is his PCP in [place].  The patient has a history of hypertension, prostate cancer, recent development of headaches, arthritis, anxiety, and melanoma.  The patient was initially referred to neurology due to developing significant severe headaches that were not resolved with various pain medications.  The patient was prescribed steroid taper and the headaches resolved and have not returned.  The patient also is having balance and motor function changes, depth perception issues, confusion and loss of vision.  The patient also has hearing loss.  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  The patient also describes visual hallucinations in which "see things that are not there" including various objects or nondescript people.  The patient denies any auditory hallucination.  Visual hallucinations have included things such as seeing a little boy running to the house or seeing rats running to the house.  The patient reports that his difficulties initially started with the development of severe headaches in late winter early spring.  Pain medications did not provide any relief.  The patient  was then placed on a steroid taper and the headaches went away and have not returned.  However, the patient continues to be quite dizzy and has times of staggering and falling.  The patient reports that he has to be very careful going up and down steps and is very cautious when doing  that.  The patient describes progressing worsening to his memory issues.  The patient's son reports that the patient will describe vision difficulties that appear confirmed through behavior.  The patient does describe seeing things to his son on numerous occasions and having significant depth perception issues.  The patient is described as being a very active person prior to this and was always working and doing things but now is stopped driving and has significantly reduced his activities.  They both deny any development of any tremors but the patient reports that his feet will get "tangled up" the patient's son reports that the patient is walking slower and has trouble going up and down steps and he is very cautious always subject to falling.  The patient has stopped driving and reports that he did so because of changes in visual acuity and visual response times.  The patient and his son both deny any family history of dementia or symptoms similar to the patient's.  The patient's father is reported to have maintained a great memory throughout his long life.  The patient's sleep is described as quite good.  The patient reports that he goes to bed at 11 PM every night and wakes up at 8 AM.  He reports that he sleeps through the night with the exception of getting up to go to the bathroom at times.  The patient reports that he generally feels rested in the morning.  The patient reports that he takes a nap every day at lunchtime and is done so since leaving the [employer].  The patient reports that he has a normal appetite.  The patient reports that his wife tells me he is snoring a great deal and we will need to at least assess the possibility of a sleep apnea some of his symptoms may be related to that.  The patient does have more confusion and disturbance at night.  He also reports that he has very "wild" dreams and there are times when he has swung out are done other behavioral things while sleeping and even  falling out of bed.  The initial consideration diagnostically with Dr. Jaynee Eagles was 1 of Lewy body dementia.  The patient has a past history of both melanoma being treated with multiple surgeries on his left ear and having prostate cancer that was treated initially and then came back a few years later and was treated with radiation without further recurrence.  The patient had an MRI completed on 01/03/2019 with an impression of chronic small vessel disease and generalized volume loss/atrophy without acute intracranial abnormality.  MRI of cervical spine showed advanced degenerative foraminal impingement Bilaterally at C3-4 and on the right at C5/6   Current Status:  The patient is described as developing worsening memory problems, visual hallucinations and visual disturbance including depth perception issues, resolved severe headaches, sleep disturbance and reduction of behavioral/life activities.  Reliability of Information: The information is derived from 1 hour face-to-face clinical interview with the patient as well as review of available medical records.  Behavioral Observation: EMILLIANO DERDERIAN  presents as a 78 y.o.-year-old  Caucasian Male who appeared his stated age. his dress was Appropriate and he was  Well Groomed and his manners were Appropriate to the situation.  his participation was indicative of Appropriate and Redirectable behaviors.  There were any physical disabilities noted.  Was slowed gait and consciousness around walking and turning as well as hearing loss. he displayed an appropriate level of cooperation and motivation.     Interactions:    Active Appropriate and Redirectable  Attention:   abnormal and attention span appeared shorter than expected for age  Memory:   abnormal; remote memory intact, recent memory impaired  Visuo-spatial:  not examined  Speech (Volume):  low  Speech:   normal; slowed responses  Thought Process:  Coherent and Relevant  Though  Content:  WNL; not suicidal and not homicidal  Orientation:   person, place, time/date and situation  Judgment:   Fair  Planning:   Fair  Affect:    Appropriate  Mood:    Euthymic  Insight:   Fair  Intelligence:   normal  Marital Status/Living: The patient was born and raised in [facility] and has or had 4 siblings.  The patient is married and continues to live with his wife.  They were married in 1960.  The patient has 4 adult children all of whom are doing fairly well.  Current Employment: The patient is not currently working.  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  Hobbies and interests include hunting and fishing.  Substance Use:  No concerns of substance abuse are reported.    Education:   HS Graduate  Medical History:   Past Medical History:  Diagnosis Date  . Allergy   . Arthritis    right hand fingers  . Chronic hypokalemia   . Headache   . HOH (hard of hearing)    right ear tinitis  . Hx of radiation therapy 07/04/14- 08/22/14   prostate bed 6600 cGy in 33 sessions  . Hypertension   . Prostate cancer ([place]) 04/19/2001   Gleason 6        Psychiatric History:  No prior psychiatric history noted.  Family Med/Psych History:  Family History  Problem Relation Age of Onset  . Cancer Mother        on neck  . Kidney failure Father   . Cancer Paternal Uncle        unknown  . Headache Son     Impression/DX:  Karen I. Schilling "Talbert Nan" is an 78 year old male who was referred by Dr. Jaynee Eagles for neuropsychological evaluation Dr. Jaynee Eagles is the patient's neurologist.  The patient was initially referred to neurology by Dr. Willey Blade who is his PCP in [place].  The patient has a history of hypertension, prostate cancer, recent development of headaches, arthritis, anxiety, and  melanoma.  The patient was initially referred to neurology due to developing significant severe headaches that were not resolved with various pain medications.  The patient was prescribed steroid taper and the headaches resolved and have not returned.  The patient also is having balance and motor function changes, depth perception issues, confusion and loss of vision.  The patient also has hearing loss.  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  The patient also describes visual hallucinations  in which "see things that are not there" including various objects or nondescript people.  The patient denies any auditory hallucination.  Visual hallucinations have included things such as seeing a little boy running to the house or seeing rats running to the house.  The patient is described as developing worsening memory problems, visual hallucinations and visual disturbance including depth perception issues, resolved severe headaches, sleep disturbance and reduction of behavioral/life activities.  Disposition/Plan:  We have set the patient up for formal neuropsychological testing.  We will utilize the Wechsler Adult Intelligence Scale-IV as well as the Wechsler Memory Scale-IV.  We will also assess after this is completed as to other objective test measures that will be needed to answer referral questions and diagnostic considerations.  Diagnosis:    Lewy body dementia with behavioral disturbance (HCC)  Visual hallucination  Memory loss         Electronically Signed   _______________________ Ilean Skill, Psy.D.

## 2019-09-25 ENCOUNTER — Encounter: Payer: Medicare Other | Admitting: Psychology

## 2019-10-13 ENCOUNTER — Encounter: Payer: Medicare Other | Attending: Psychology | Admitting: Psychology

## 2019-10-13 ENCOUNTER — Encounter: Payer: Self-pay | Admitting: Psychology

## 2019-10-13 ENCOUNTER — Other Ambulatory Visit: Payer: Self-pay

## 2019-10-13 DIAGNOSIS — H9191 Unspecified hearing loss, right ear: Secondary | ICD-10-CM | POA: Insufficient documentation

## 2019-10-13 DIAGNOSIS — G3183 Dementia with Lewy bodies: Secondary | ICD-10-CM | POA: Insufficient documentation

## 2019-10-13 DIAGNOSIS — Z85820 Personal history of malignant melanoma of skin: Secondary | ICD-10-CM | POA: Insufficient documentation

## 2019-10-13 DIAGNOSIS — Z923 Personal history of irradiation: Secondary | ICD-10-CM | POA: Insufficient documentation

## 2019-10-13 DIAGNOSIS — F0281 Dementia in other diseases classified elsewhere with behavioral disturbance: Secondary | ICD-10-CM | POA: Diagnosis present

## 2019-10-13 DIAGNOSIS — R441 Visual hallucinations: Secondary | ICD-10-CM | POA: Insufficient documentation

## 2019-10-13 DIAGNOSIS — Z8546 Personal history of malignant neoplasm of prostate: Secondary | ICD-10-CM | POA: Insufficient documentation

## 2019-10-13 DIAGNOSIS — M1990 Unspecified osteoarthritis, unspecified site: Secondary | ICD-10-CM | POA: Insufficient documentation

## 2019-10-13 DIAGNOSIS — I10 Essential (primary) hypertension: Secondary | ICD-10-CM | POA: Insufficient documentation

## 2019-10-13 DIAGNOSIS — R413 Other amnesia: Secondary | ICD-10-CM | POA: Insufficient documentation

## 2019-10-13 DIAGNOSIS — F02818 Dementia in other diseases classified elsewhere, unspecified severity, with other behavioral disturbance: Secondary | ICD-10-CM

## 2019-10-13 NOTE — Progress Notes (Signed)
The patient arrived on time to his 8:00 testing appointment and was accompanied by his daughter. The evaluation lasted 180 minutes.  Behavioral Observations:  Appearance:  Appropriately dressed with adequate hygiene (e.g. long dirty fingernails). Gait: Ambulated independently without assistance.  Speech: Mostly clear, reduced rate, tone & volume; WFD noted. Thought process: Mostly organized, tangential and perseverative at times.  Mood/Affect:  Mostly euthymic, restricted range. Interpersonal: Polite, somewhat disinhibited.  Orientation: o x 4 Effort/Motivation: Good   He appeared to have trouble hearing, seeing, and understanding test items and required additional prompting on most measures. He had significant trouble seeing items on the WAIS-IV Coding subtest, a visual paper and pencil task that requires individuals to match numbers with symbols based on a key at the top of the page by drawing the correct symbol in boxes provided on the response booklet. He exhibited good frustration tolerance on questions he did not know or tasks that were more difficult.   Tests Administered:  Wechsler Adult Intelligence Scale, 4th Edition (WAIS-IV)  Wechsler Memory Scale, 4th edition, Older Adult Battery (WMS-IV-OA)  Results:   WAIS-IV  Composite Score Summary  Scale Sum of Scaled Scores Composite Score Percentile Rank 95% Conf. Interval Qualitative Description  Verbal Comprehension 22 VCI 85 16 80-91 Low Average  Perceptual Reasoning 15 PRI 71 3 66-79 Borderline  Working Memory 12 WMI 77 6 72-85 Borderline  General Ability 37 GAI 76 5 72-82 Borderline   Index Level Discrepancy Comparisons  Comparison Score 1 Score 2 Difference Critical Value .05 Significant Difference Y/N Base Rate by Overall Sample  VCI - PRI 85 71 14 9.75 Y 15.2  VCI - WMI 85 77 8 8.82 N 27.1  PRI - WMI 71 77 -6 10.99 N 34.7   WMS-IV (Older Adult Battery) Index Score Summary  Index Sum of Scaled Scores Index  Score Percentile Rank 95% Confidence Interval Qualitative Descriptor  Auditory Memory (AMI) 33 90 25 84-97 Average  Visual Memory (VMI) 10 73 4 69-79 Borderline  Immediate Memory (IMI) 18 75 5 70-82 Borderline  Delayed Memory (DMI) 25 89 23 82-98 Low Average   Primary Subtest Scaled Score Summary  Subtest Domain Raw Score Scaled Score Percentile Rank  Logical Memory I AM 18 7 16   Logical Memory II AM 7 7 16   Verbal Paired Associates I AM 16 9 37  Verbal Paired Associates II AM 5 10 50  Visual Reproduction I VM 8 2 0.4  Visual Reproduction II VM 8 8 25   Symbol Span VWM 1 2 0.4   Auditory Memory Process Score Summary  Process Score Raw Score Scaled Score Percentile Rank Cumulative Percentage (Base Rate)  LM II Recognition 21 - - >75%  VPA II Recognition 27 - - 51-75%   Visual Memory Process Score Summary  Process Score Raw Score Scaled Score Percentile Rank Cumulative Percentage (Base Rate)  VR II Recognition 0 - - <=2%    ABILITY-MEMORY ANALYSIS  Ability Score:  VCI: 85 Date of Testing:  WAIS-IV; WMS-IV 2019/10/13  Predicted Difference Method   Index Predicted WMS-IV Index Score Actual WMS-IV Index Score Difference Critical Value  Significant Difference Y/N Base Rate  Auditory Memory 92 90 2 9.14 N   Visual Memory 93 73 20 7.30 Y 5-10%  Immediate Memory 91 75 16 10.12 Y 10%  Delayed Memory 92 89 3 10.62 N   Statistical significance (critical value) at the .01 level.   Contrast Scaled Scores  Score Score 1 Score 2 Contrast Scaled Score  General Ability Index vs. Auditory Memory Index 76 90 10  General Ability Index vs. Visual Memory Index 76 73 7  General Ability Index vs. Immediate Memory Index 76 75 7  General Ability Index vs. Delayed Memory Index 76 89 10  Verbal Comprehension Index vs. Auditory Memory Index 85 90 9  Perceptual Reasoning Index vs. Visual Memory Index 71 73 8  Working Memory Index vs. Auditory Memory Index 77 90 10

## 2019-12-18 ENCOUNTER — Encounter: Payer: Medicare Other | Admitting: Psychology

## 2019-12-19 ENCOUNTER — Encounter: Payer: Self-pay | Admitting: Psychology

## 2019-12-19 ENCOUNTER — Encounter: Payer: Medicare Other | Attending: Psychology | Admitting: Psychology

## 2019-12-19 ENCOUNTER — Other Ambulatory Visit: Payer: Self-pay

## 2019-12-19 DIAGNOSIS — Z923 Personal history of irradiation: Secondary | ICD-10-CM | POA: Insufficient documentation

## 2019-12-19 DIAGNOSIS — R441 Visual hallucinations: Secondary | ICD-10-CM | POA: Insufficient documentation

## 2019-12-19 DIAGNOSIS — R413 Other amnesia: Secondary | ICD-10-CM | POA: Diagnosis not present

## 2019-12-19 DIAGNOSIS — M1990 Unspecified osteoarthritis, unspecified site: Secondary | ICD-10-CM | POA: Diagnosis not present

## 2019-12-19 DIAGNOSIS — H9190 Unspecified hearing loss, unspecified ear: Secondary | ICD-10-CM | POA: Insufficient documentation

## 2019-12-19 DIAGNOSIS — F0281 Dementia in other diseases classified elsewhere with behavioral disturbance: Secondary | ICD-10-CM | POA: Diagnosis present

## 2019-12-19 DIAGNOSIS — I10 Essential (primary) hypertension: Secondary | ICD-10-CM | POA: Diagnosis not present

## 2019-12-19 DIAGNOSIS — F419 Anxiety disorder, unspecified: Secondary | ICD-10-CM | POA: Diagnosis not present

## 2019-12-19 DIAGNOSIS — Z85820 Personal history of malignant melanoma of skin: Secondary | ICD-10-CM | POA: Diagnosis not present

## 2019-12-19 DIAGNOSIS — R0683 Snoring: Secondary | ICD-10-CM | POA: Diagnosis not present

## 2019-12-19 DIAGNOSIS — Z8546 Personal history of malignant neoplasm of prostate: Secondary | ICD-10-CM | POA: Insufficient documentation

## 2019-12-19 DIAGNOSIS — H9191 Unspecified hearing loss, right ear: Secondary | ICD-10-CM | POA: Insufficient documentation

## 2019-12-19 DIAGNOSIS — G3183 Dementia with Lewy bodies: Secondary | ICD-10-CM | POA: Insufficient documentation

## 2019-12-19 NOTE — Progress Notes (Signed)
Neuropsychological Evaluation   Patient:  William Gallagher   DOB: Feb 09, 1941  MR Number: [id number]  Location: [facility]ICINE AND REHABILITATION [place], [id number] [place] [id number] Dept: [phone]  Start: 10 AM End: 11 AM  Provider/Observer:     Edgardo Roys PsyD  Chief Complaint:      Chief Complaint  Patient presents with  . Hallucinations  . Hearing Loss  . Other    Visual hallucinations, changes in depth perception and changes in gait.    Reason For Service:      William Gallagher "Gallagher William" is an 79 year old male who was referred by Dr. Jaynee Eagles for neuropsychological evaluation Dr. Jaynee Eagles is the patient's neurologist.  The patient was initially referred to neurology by Dr. Willey Blade who is his PCP in [place].  The patient has a history of hypertension, prostate cancer, recent development of headaches, arthritis, anxiety, and melanoma.  The patient was initially referred to neurology due to developing significant severe headaches that were not resolved with various pain medications.  The patient was prescribed steroid taper and the headaches resolved and have not returned.  The patient also is having balance and motor function changes, depth perception issues, confusion and loss of vision.  The patient also has hearing loss.  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  The patient also describes visual hallucinations in which "see things that are not there" including various objects or nondescript people.  The patient denies any auditory hallucination.  Visual hallucinations have included things such as seeing a little boy running to the house or seeing rats running to the house.  The patient reports that his  difficulties initially started with the development of severe headaches in late winter early spring.  Pain medications did not provide any relief.  The patient was then placed on a steroid taper and the headaches went away and have not returned.  However, the patient continues to be quite dizzy and has times of staggering and falling.  The patient reports that he has to be very careful going up and down steps and is very cautious when doing that.  The patient describes progressive worsening to his memory issues.  The patient's son reports that the patient will describe vision difficulties that appear confirmed through behavior.  The patient does describe seeing things to his son on numerous occasions and having significant depth perception issues.  The patient is described as being a very active person prior to this and was always working and doing things but now has stopped driving and has significantly reduced his activities.  They both deny any development of any tremors but the patient reports that his feet will get "tangled up" the patient's son reports that the patient is walking slower and has trouble going up and down steps and he is very cautious and always subject to falling.  The patient has stopped driving and reports that he did so because of changes in visual acuity and visual response times.  The patient and his son both deny any family history of dementia or symptoms similar to the patient's.  The patient's father is reported to have maintained a great memory throughout his long life.  The patient's sleep is described as quite good.  The patient reports that  he goes to bed at 11 PM every night and wakes up at 8 AM.  He reports that he sleeps through the night with the exception of getting up to go to the bathroom at times.  The patient reports that he generally feels rested in the morning.  The patient reports that he takes a nap every day at lunchtime and is done so since leaving the  [employer].  The patient reports that he has a normal appetite.  The patient reports that his wife tells me he is snoring a great deal and we will need to at least assess the possibility of a sleep apnea some of his symptoms may be related to that.  The patient does have more confusion and disturbance at night.  He also reports that he has very "wild" dreams and there are times when he has swung out are done other behavioral things while sleeping and even falling out of bed.  The initial consideration diagnostically with Dr. Jaynee Eagles was 1 of Lewy body dementia.  The patient has a past history of both melanoma being treated with multiple surgeries on his left ear and having prostate cancer that was treated initially and then came back a few years later and was treated with radiation without further recurrence.  The patient had an MRI completed on 01/03/2019 with an impression of chronic small vessel disease and generalized volume loss/atrophy without acute intracranial abnormality.  MRI of cervical spine showed advanced degenerative foraminal impingement Bilaterally at C3-4 and on the right at C5/6  Behavioral Observations: Appearance: Appropriately dressed with adequate hygiene (e.g. long dirty fingernails). Gait:Ambulated independently without assistance.  Speech:Mostly clear, reduced rate, tone & volume; WFD noted. Thought process:Mostly organized, tangential and perseverative at times.  Mood/Affect: Mostly euthymic, restricted range. Interpersonal: Polite, somewhat disinhibited.  Orientation: o x 4 Effort/Motivation: Good   He appeared to have trouble hearing, seeing, and understanding test items and required additional prompting on most measures. He had significant trouble seeing items on the WAIS-IV Coding subtest, a visual paper and pencil task that requires individuals to match numbers with symbols based on a "key" at the top of the page by drawing the correct symbol in boxes provided on  the response booklet. He exhibited good frustration tolerance on questions he did not know or tasks that were more difficult.   Tests Administered:  Wechsler Adult Intelligence Scale, 4th Edition (WAIS-IV)  Wechsler Memory Scale, 4th edition, Older Adult Battery (WMS-IV-OA)   Test Results:   Initially, an estimation was made as to likely historical/premorbid cognitive functioning and overall intellectual functioning.  Based on the patient's educational history and occupational history it is estimated that the patient should be functioning in the average range on various cognitive measures but potentially have some areas where he is in the high average range on various individual subtest of components previously.  Composite Score Summary  Scale Sum of Scaled Scores Composite Score Percentile Rank 95% Conf. Interval Qualitative Description  Verbal Comprehension 22 VCI 85 16 80-91 Low Average  Perceptual Reasoning 15 PRI 71 3 66-79 Borderline  Working Memory 12 WMI 77 6 72-85 Borderline  General Ability 37 GAI 76 5 72-82 Borderline   Initially, the patient was administered the Wechsler Adult Intelligence Scale-IV in order to derived a global estimate of overall cognitive functioning as well as look at a wide range of various cognitive functions.  The patient produced a general abilities index score of 76 which falls at the 5th percentile and is  in the borderline range.  This measure suggest that the patient is having significant difficulties in 1 or more individual cognitive domain.  This performance is significantly below predicted levels and suggest a significant loss of functioning in various areas.  Verbal Comprehension Subtests Summary  Subtest Raw Score Scaled Score Percentile Rank Reference Group Scaled Score SEM  Similarities 14 7 16 5  0.90  Vocabulary 20 7 16 6  0.73  Information 8 8 25 7  0.73  (Comprehension) 19 9 37 8 1.08    The patient produced a verbal comprehension index  score of 85 which falls at the 16th percentile and is in the low average range.  The patient is social judgment and comprehension and to a lesser degree his general fund of information are in the average range and may depict only slight decreases in historical functioning.  However, the patient shows mild deficits with regard to verbal reasoning and problem-solving and his general vocabulary knowledge and availability of words in his lexicon.  Perceptual Reasoning Subtests Summary  Subtest Raw Score Scaled Score Percentile Rank Reference Group Scaled Score SEM  Block Design 5 2 0.4 1 1.34  Matrix Reasoning 4 5 5 1  1.12  Visual Puzzles 7 8 25 5  1.27    The patient produced a perceptual reasoning index score of 71 which falls in the 3rd percentile and is in the borderline range of overall functioning.  This is a severely impaired performance with profound difficulties in visual reasoning and problem-solving visual analysis and organization but only mild difficulties in visual estimation and prediction.  Working Doctor, general practice Raw Score Scaled Score Percentile Rank Reference Group Scaled Score SEM  Digit Span 14 5 5 3  0.85  Arithmetic 8 7 16 5  0.99   The patient produced a working memory index score of 77 which falls at the 6 percentile and is in the borderline range.  There were significant deficits with regard to auditory encoding and multi processing abilities.   Processing Speed Subtests Summary  Subtest Raw Score Scaled Score Percentile Rank Reference Group Scaled Score SEM  Symbol Search 0 1 0.1 1 1.12  Coding 0 1 0.1 1 1.12   An attempt was made to assess visual processing speed and overall speed of mental operations.  However, these were primary visual tasks and his visual deficits and visual spatial deficits left him unable to complete this task.   Index Score Summary  Index Sum of Scaled Scores Index Score Percentile Rank 95% Confidence Interval Qualitative  Descriptor  Auditory Memory (AMI) 33 90 25 84-97 Average  Visual Memory (VMI) 10 73 4 69-79 Borderline  Immediate Memory (IMI) 18 75 5 70-82 Borderline  Delayed Memory (DMI) 25 89 23 82-98 Low Average   The patient was then administered the Wechsler Memory Scale-IV for older adults.  The patient had shown some significant deficits with regard to auditory encoding.  However, the patient is auditory memory (auditory memory index score of 90 and in the 25th percentile/average range) was his greatest strength throughout all of the testing procedures administered.  All this is slightly below predicted levels and does indicate a mild loss in function this is still an area of adequate performance.  The patient's visual memory index score was 73 which fell at the 4th percentile and was significantly impaired.  This is consistent with significant visual-spatial deficits identified earlier.  We also calculated the patient's immediate memory and delayed memory functions.  The patient produced an immediate memory  index score of 75 which falls at the 5th percentile and is in the borderline range.  This is significantly impaired although the majority of these deficits had to do with visual memory deficits.  The patient did better on delayed memory index score where he produced a delayed memory index score of 89 following at the 23rd percentile and in the low average range.  This pattern in conjunction with the recognition formats presented suggest that the greatest deficit for his memory have to do with retrieval deficits in executive functioning abilities to find stored information in conjunction with encoding deficits rather than appear inability to store and organize information.  The patient's performance improved significantly with cueing.  Auditory Memory Process Score Summary  Process Score Raw Score Scaled Score Percentile Rank Cumulative Percentage (Base Rate)  LM II Recognition 21 - - >75%  VPA II  Recognition 27 - - 51-75%   Visual Memory Process Score Summary  Process Score Raw Score Scaled Score Percentile Rank Cumulative Percentage (Base Rate)  VR II Recognition 0 - - <=2%   Summary of Results:   Overall, the results of the current objective neuropsychological evaluation are consistent with significant cognitive deficits including significant reduction in global cognitive functioning, significant reduction in verbal reasoning and problem-solving as well as visual reasoning and problem-solving, significant deficits for visual-spatial abilities and visual analysis and organization, significant deficits in auditory and visual encoding abilities.  As far as memory functions, the patient primarily demonstrated deficits with regard to encoding new information and retrieval of stored information with less indication of deficits with regard to storage and organization of that information.  Information that was initially stored was available for later retrieval and the patient showed significant improvements in memory functions under a cued/recognition format.  This pattern suggest that the primary memory deficits are related to attention/encoding deficits and retrieval deficits primarily mediated by frontal brain regions rather than temporal lobe disturbance.  Impression/Diagnosis:   Overall, the results of the current neuropsychological evaluation are consistent with diagnoses initially considered by Dr. Jaynee Eagles related to patterns consistent with Lewy body dementia.  There have been significant changes in the patient that cannot be simply explained by chronic small vessel disease and general atrophy.  The primary deficits have to do with changes in frontal and parietal brain regions with some disturbance in occipital regions.  The patient shows significant visual-spatial deficits, reasoning and problem solving both verbal and visual as well as memory deficits related to auditory and visual encoding  deficits and significant deficits for retrieval of information.  The patient was actually able to learn information and his performance significantly improved on memory task when he was placed in a cued/retrieval format rather than a free recall format.  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  At this point, the patient does not describe significant changes in expressive language although there was some slowing in verbal output or changes or problems in regulating body functions or sleep disturbance.  It is clear at this point that the most consistent diagnostic consideration at this point is Lewy body dementia.  As this is a progressive condition it is likely that the patient will continue to show continued progression and loss of function.  The patient will likely need increasing and consistent help from others as his condition progresses.  This pattern does not appear to be consistent with other cortically mediated progressive dementia such as Alzheimer's and his deficits and symptom presentation are also not consistent with what could be simply explained by cerebrovascular changes and small vessel disease.  I will sit down with the patient and his family regarding recommendations going forward.  It will be important for him to continue to be monitored by his  neurologist with the expected progressive changes over time.  Ongoing changes in executive functioning, motivation, memory, and visual-spatial deficits and potentially worsening of visual hallucinations will all continue to be needed to be addressed over time.  Diagnosis:    Axis I: Lewy body dementia with behavioral disturbance ([place])  Visual hallucination  Memory loss   Ilean Skill, Psy.D. Neuropsychologist

## 2019-12-25 DIAGNOSIS — Z961 Presence of intraocular lens: Secondary | ICD-10-CM | POA: Diagnosis not present

## 2019-12-25 DIAGNOSIS — H59032 Cystoid macular edema following cataract surgery, left eye: Secondary | ICD-10-CM | POA: Diagnosis not present

## 2020-01-09 ENCOUNTER — Ambulatory Visit: Payer: Medicare Other | Admitting: Psychology

## 2020-01-11 DIAGNOSIS — Z961 Presence of intraocular lens: Secondary | ICD-10-CM | POA: Diagnosis not present

## 2020-01-11 DIAGNOSIS — H59032 Cystoid macular edema following cataract surgery, left eye: Secondary | ICD-10-CM | POA: Diagnosis not present

## 2020-01-12 ENCOUNTER — Encounter: Payer: Medicare Other | Admitting: Psychology

## 2020-01-12 ENCOUNTER — Encounter: Payer: Self-pay | Admitting: Psychology

## 2020-01-12 ENCOUNTER — Other Ambulatory Visit: Payer: Self-pay

## 2020-01-12 DIAGNOSIS — F02818 Dementia in other diseases classified elsewhere, unspecified severity, with other behavioral disturbance: Secondary | ICD-10-CM

## 2020-01-12 DIAGNOSIS — G3183 Dementia with Lewy bodies: Secondary | ICD-10-CM | POA: Diagnosis not present

## 2020-01-12 DIAGNOSIS — R413 Other amnesia: Secondary | ICD-10-CM | POA: Diagnosis not present

## 2020-01-12 DIAGNOSIS — I10 Essential (primary) hypertension: Secondary | ICD-10-CM | POA: Diagnosis not present

## 2020-01-12 DIAGNOSIS — R441 Visual hallucinations: Secondary | ICD-10-CM

## 2020-01-12 DIAGNOSIS — H9191 Unspecified hearing loss, right ear: Secondary | ICD-10-CM | POA: Diagnosis not present

## 2020-01-12 DIAGNOSIS — H9190 Unspecified hearing loss, unspecified ear: Secondary | ICD-10-CM | POA: Diagnosis not present

## 2020-01-12 DIAGNOSIS — F0281 Dementia in other diseases classified elsewhere with behavioral disturbance: Secondary | ICD-10-CM | POA: Diagnosis not present

## 2020-01-12 DIAGNOSIS — M1990 Unspecified osteoarthritis, unspecified site: Secondary | ICD-10-CM | POA: Diagnosis not present

## 2020-01-12 DIAGNOSIS — R0683 Snoring: Secondary | ICD-10-CM | POA: Diagnosis not present

## 2020-01-12 DIAGNOSIS — Z85820 Personal history of malignant melanoma of skin: Secondary | ICD-10-CM | POA: Diagnosis not present

## 2020-01-12 DIAGNOSIS — Z923 Personal history of irradiation: Secondary | ICD-10-CM | POA: Diagnosis not present

## 2020-01-12 NOTE — Progress Notes (Signed)
Today I provided feedback regarding the results of the recent neuropsychological evaluation.  This was a 1 hour in person visit that was conducted at my outpatient clinic office with myself, the patient and his son present.  We reviewed the results of the recent neuropsychological evaluation and went over the diagnostic considerations generated from that.  The patient appeared to be able to understand these results and we worked on strategies for coping and adjusting and what needs to be done going forward as far as his care.  Everyone was on the same page with his needs.  The patient has completely stopped driving much prior to this because of his vision issues and continues to be quite concerned about his inability to read text and other significant visual changes that are likely beyond just issues related to his primary visual processing.  Below you will find a copy of the summary of results and diagnostic considerations from the formal neuropsychological evaluation.  The complete evaluation can be found in the patient's electronic medical records dated 12/19/2019.   Summary of Results:                        Overall, the results of the current objective neuropsychological evaluation are consistent with significant cognitive deficits including significant reduction in global cognitive functioning, significant reduction in verbal reasoning and problem-solving as well as visual reasoning and problem-solving, significant deficits for visual-spatial abilities and visual analysis and organization, significant deficits in auditory and visual encoding abilities.  As far as memory functions, the patient primarily demonstrated deficits with regard to encoding new information and retrieval of stored information with less indication of deficits with regard to storage and organization of that information.  Information that was initially stored was available for later retrieval and the patient showed significant improvements  in memory functions under a cued/recognition format.  This pattern suggest that the primary memory deficits are related to attention/encoding deficits and retrieval deficits primarily mediated by frontal brain regions rather than temporal lobe disturbance.  Impression/Diagnosis:                     Overall, the results of the current neuropsychological evaluation are consistent with diagnoses initially considered by Dr. Jaynee Eagles related to patterns consistent with Lewy body dementia.  There have been significant changes in the patient that cannot be simply explained by chronic small vessel disease and general atrophy.  The primary deficits have to do with changes in frontal and parietal brain regions with some disturbance in occipital regions.  The patient shows significant visual-spatial deficits, reasoning and problem solving both verbal and visual as well as memory deficits related to auditory and visual encoding deficits and significant deficits for retrieval of information.  The patient was actually able to learn information and his performance significantly improved on memory task when he was placed in a cued/retrieval format rather than a free recall format.  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  At this point, the patient does not describe significant changes  in expressive language although there was some slowing in verbal output or changes or problems in regulating body functions or sleep disturbance.  It is clear at this point that the most consistent diagnostic consideration at this point is Lewy body dementia.  As this is a progressive condition it is likely that the patient will continue to show continued progression and loss of function.  The patient will likely need increasing and consistent help from others as his condition progresses.  This pattern does not appear to be consistent with other cortically mediated progressive dementia such as Alzheimer's and his deficits and symptom presentation are also not consistent with what could be simply explained by cerebrovascular changes and small vessel disease.  I will sit down with the patient and his family regarding recommendations going forward.  It will be important for him to continue to be monitored by his neurologist with the expected progressive changes over time.  Ongoing changes in executive functioning, motivation, memory, and visual-spatial deficits and potentially worsening of visual hallucinations will all continue to be needed to be addressed over time.  Diagnosis:                                Lewy body dementia with behavioral disturbance ([place])  Visual hallucination  Memory loss   Ilean Skill, Psy.D. Neuropsychologist

## 2020-01-25 DIAGNOSIS — H59032 Cystoid macular edema following cataract surgery, left eye: Secondary | ICD-10-CM | POA: Diagnosis not present

## 2020-01-29 DIAGNOSIS — Z08 Encounter for follow-up examination after completed treatment for malignant neoplasm: Secondary | ICD-10-CM | POA: Diagnosis not present

## 2020-01-29 DIAGNOSIS — D225 Melanocytic nevi of trunk: Secondary | ICD-10-CM | POA: Diagnosis not present

## 2020-01-29 DIAGNOSIS — Z85820 Personal history of malignant melanoma of skin: Secondary | ICD-10-CM | POA: Diagnosis not present

## 2020-01-29 DIAGNOSIS — Z1283 Encounter for screening for malignant neoplasm of skin: Secondary | ICD-10-CM | POA: Diagnosis not present

## 2020-01-29 DIAGNOSIS — C44622 Squamous cell carcinoma of skin of right upper limb, including shoulder: Secondary | ICD-10-CM | POA: Diagnosis not present

## 2020-02-08 DIAGNOSIS — Z961 Presence of intraocular lens: Secondary | ICD-10-CM | POA: Diagnosis not present

## 2020-02-08 DIAGNOSIS — H59032 Cystoid macular edema following cataract surgery, left eye: Secondary | ICD-10-CM | POA: Diagnosis not present

## 2020-02-12 DIAGNOSIS — C44629 Squamous cell carcinoma of skin of left upper limb, including shoulder: Secondary | ICD-10-CM | POA: Diagnosis not present

## 2020-02-12 DIAGNOSIS — C44622 Squamous cell carcinoma of skin of right upper limb, including shoulder: Secondary | ICD-10-CM | POA: Diagnosis not present

## 2020-02-12 DIAGNOSIS — B078 Other viral warts: Secondary | ICD-10-CM | POA: Diagnosis not present

## 2020-02-15 DIAGNOSIS — H59032 Cystoid macular edema following cataract surgery, left eye: Secondary | ICD-10-CM | POA: Diagnosis not present

## 2020-02-15 DIAGNOSIS — H5319 Other subjective visual disturbances: Secondary | ICD-10-CM | POA: Diagnosis not present

## 2020-03-25 DIAGNOSIS — Z08 Encounter for follow-up examination after completed treatment for malignant neoplasm: Secondary | ICD-10-CM | POA: Diagnosis not present

## 2020-03-25 DIAGNOSIS — L821 Other seborrheic keratosis: Secondary | ICD-10-CM | POA: Diagnosis not present

## 2020-03-25 DIAGNOSIS — Z85828 Personal history of other malignant neoplasm of skin: Secondary | ICD-10-CM | POA: Diagnosis not present

## 2020-04-04 DIAGNOSIS — H59032 Cystoid macular edema following cataract surgery, left eye: Secondary | ICD-10-CM | POA: Diagnosis not present

## 2020-09-17 IMAGING — MR MR HEAD W/O CM
6 of 10 series · 31 of 48 positions shown · non-contrast
Comparison: None.

CLINICAL DATA: Headache for 1 month.  History of prostate cancer.

EXAM:
MRI HEAD WITHOUT CONTRAST
TECHNIQUE: Multiplanar, multiecho pulse sequences of the brain and surrounding
structures were obtained without intravenous contrast.

[Series 2: DWI · axial · 3.0mm · 0.75mm/px · z∈[-70,+91]mm · 7 of 55 slices shown (1 of 4)]
[im 1/55]
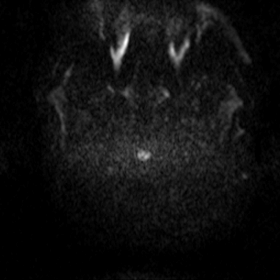
[im 10/55]
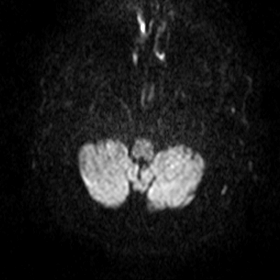
[im 19/55]
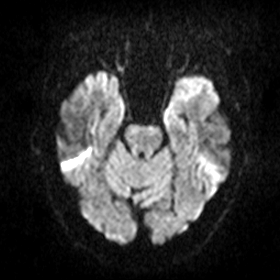
[im 28/55]
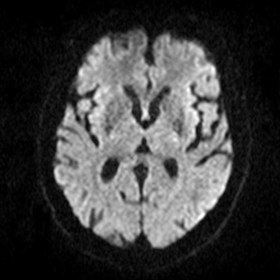
[im 37/55]
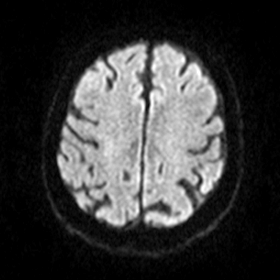
[im 46/55]
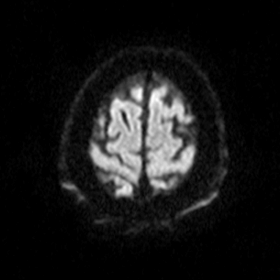
[im 55/55]
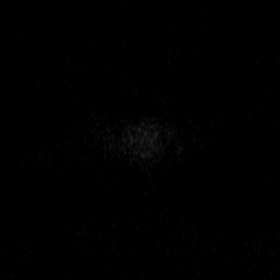

[Series 3: DWI · axial · 3.0mm · 0.75mm/px · z∈[-71,+91]mm · 7 of 54 slices shown (2 of 4)]
[im 1/54]
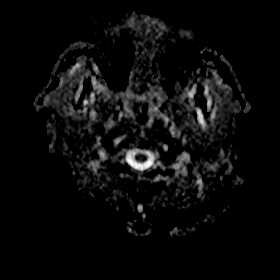
[im 9/54]
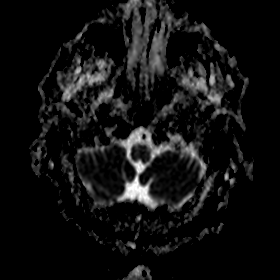
[im 18/54]
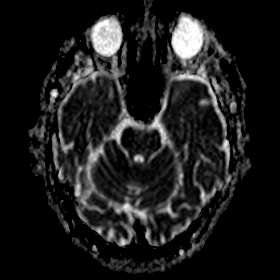
[im 27/54]
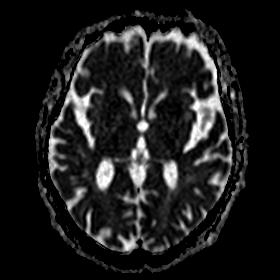
[im 36/54]
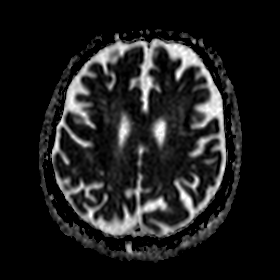
[im 45/54]
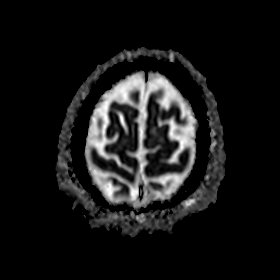
[im 54/54]
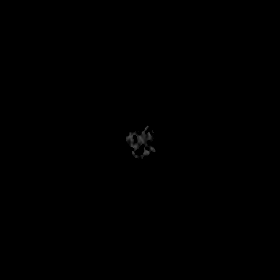

[Series 4: DWI · coronal · 5.0mm · 0.47mm/px · 4 of 38 slices shown (3 of 4)]
[im 1/38]
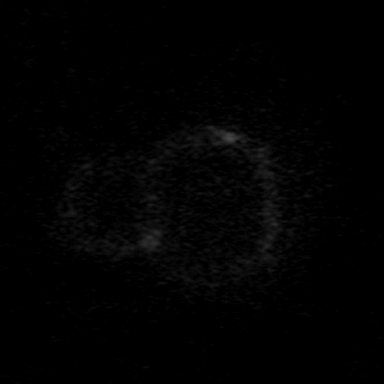
[im 13/38]
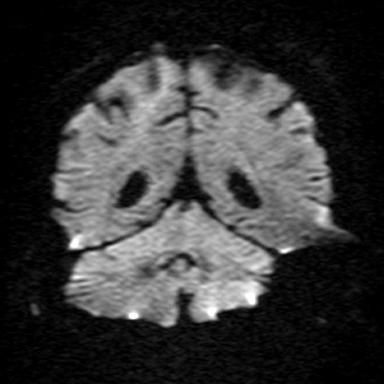
[im 25/38]
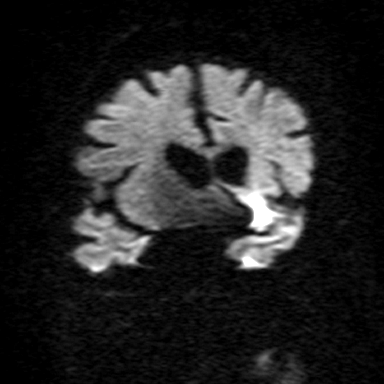
[im 38/38]
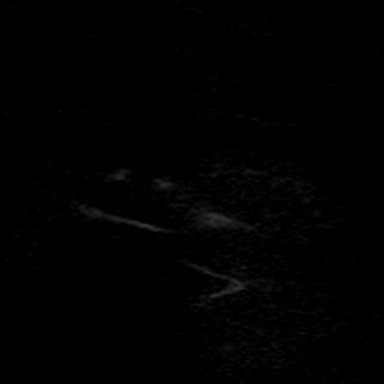

[Series 5: DWI · coronal · 5.0mm · 0.55mm/px · 4 of 38 slices shown (4 of 4)]
[im 1/38]
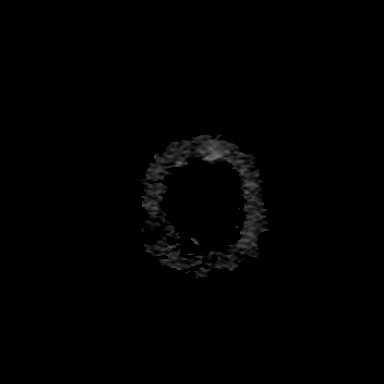
[im 13/38]
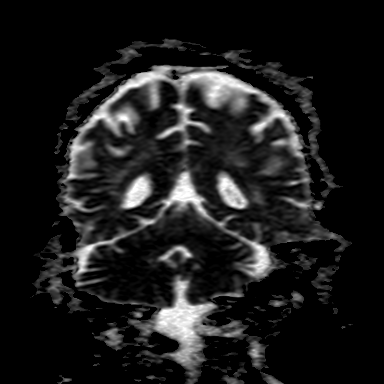
[im 25/38]
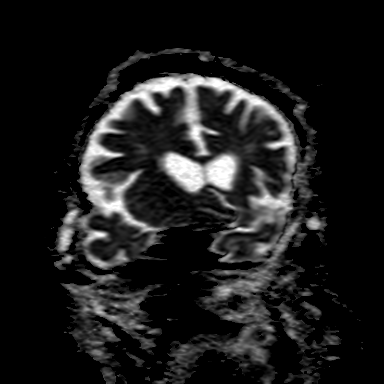
[im 38/38]
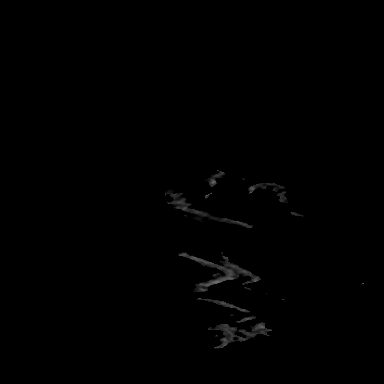

[Series 7: T2 · axial · 5.0mm · 0.49mm/px · z∈[-70,+71]mm · 3 of 23 slices shown]
[im 1/23]
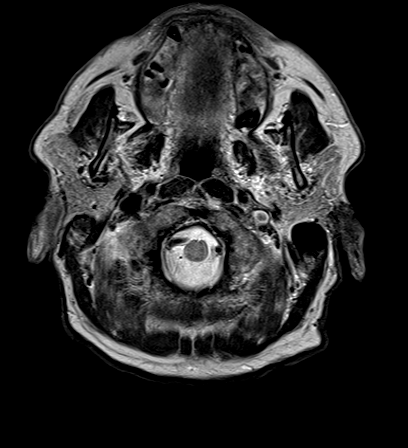
[im 12/23]
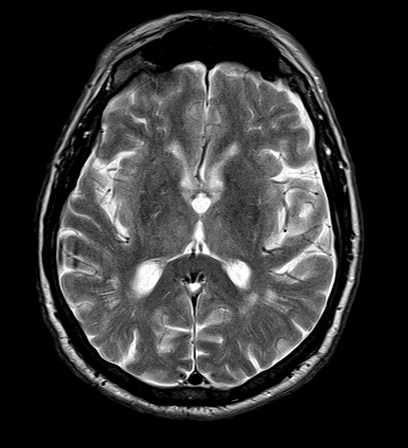
[im 23/23]
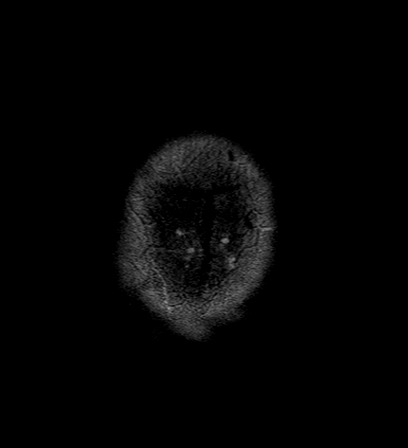

[Series 8: FLAIR · axial · 3.0mm · 0.34mm/px · z∈[-69,+68]mm · 6 of 47 slices shown]
[im 1/47]
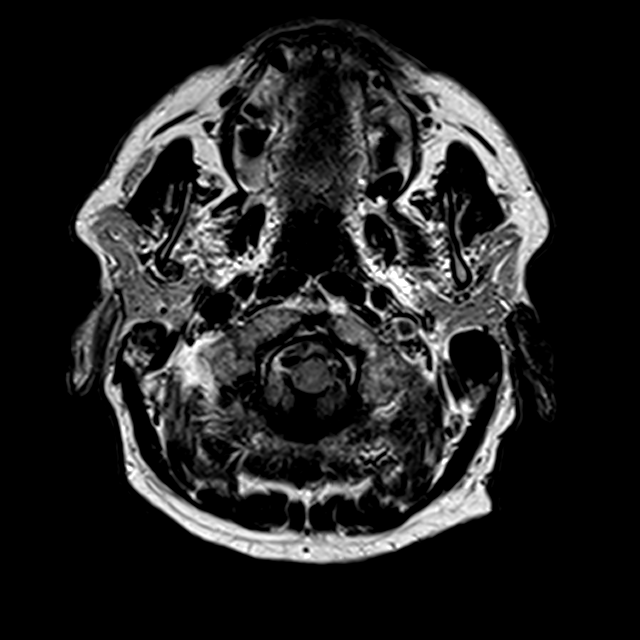
[im 10/47]
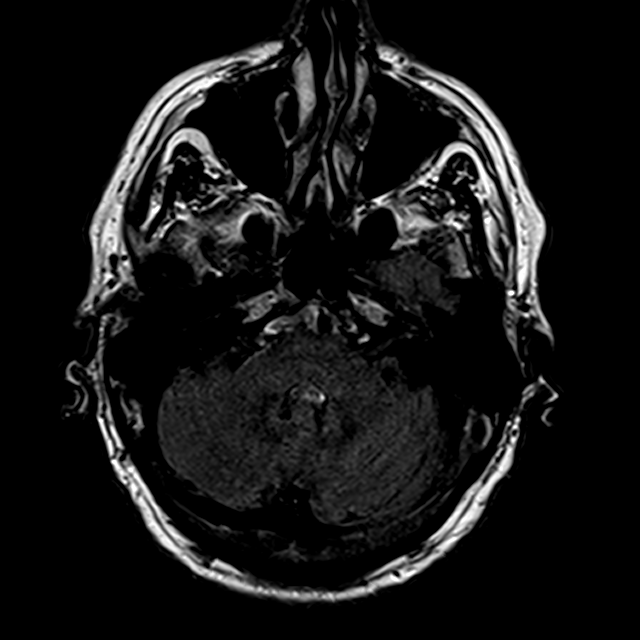
[im 19/47]
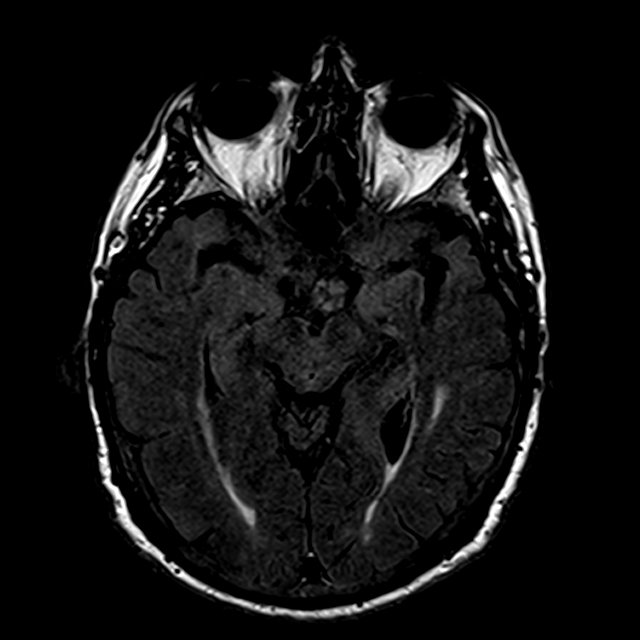
[im 28/47]
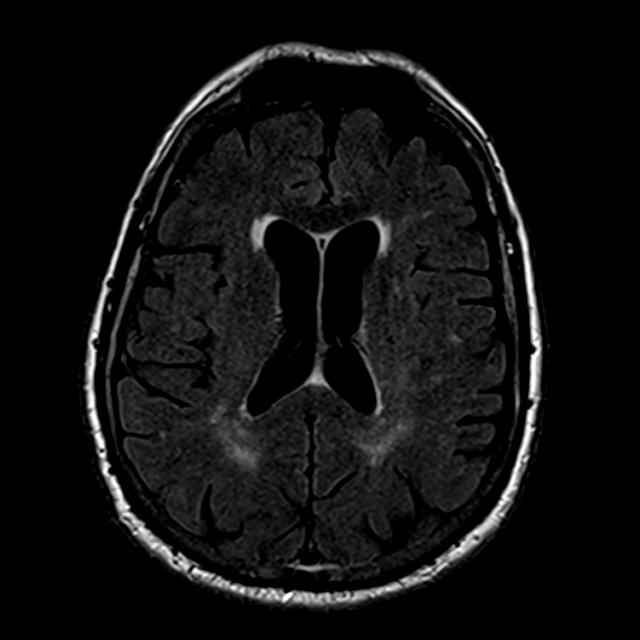
[im 37/47]
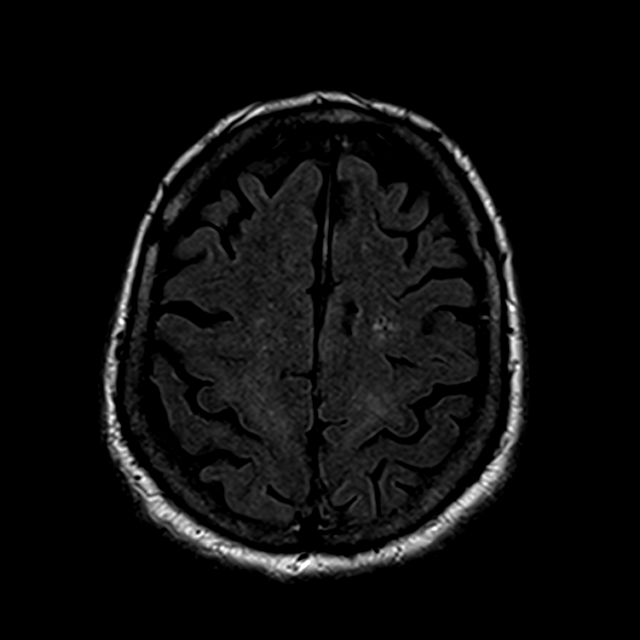
[im 47/47]
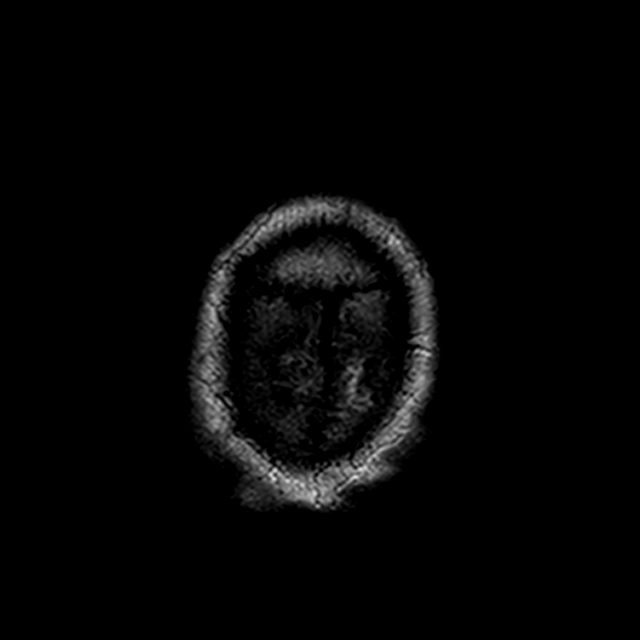

[31 of 48 positions shown; findings below may reference images not displayed]

FINDINGS: BRAIN: There is no acute infarct, acute hemorrhage, hydrocephalus or
extra-axial collection. The midline structures are normal. No
midline shift or other mass effect. Early confluent hyperintense
T2-weighted signal of the periventricular and deep white matter,
most commonly due to chronic ischemic microangiopathy. Generalized
atrophy without lobar predilection. Single focus of chronic
microhemorrhage in the left parietal lobe.

VASCULAR: Major intracranial arterial and venous sinus flow voids
are normal.

SKULL AND UPPER CERVICAL SPINE: Calvarial bone marrow signal is
normal. There is no skull base mass. Visualized upper cervical spine
and soft tissues are normal.

SINUSES/ORBITS: No fluid levels or advanced mucosal thickening. No
mastoid or middle ear effusion. The orbits are normal.
IMPRESSION: Chronic small vessel disease and generalized volume loss without
acute intracranial abnormality.

## 2021-01-09 DIAGNOSIS — H59032 Cystoid macular edema following cataract surgery, left eye: Secondary | ICD-10-CM | POA: Diagnosis not present

## 2021-02-04 DIAGNOSIS — N182 Chronic kidney disease, stage 2 (mild): Secondary | ICD-10-CM | POA: Diagnosis not present

## 2021-02-04 DIAGNOSIS — Z79899 Other long term (current) drug therapy: Secondary | ICD-10-CM | POA: Diagnosis not present

## 2021-02-04 DIAGNOSIS — E876 Hypokalemia: Secondary | ICD-10-CM | POA: Diagnosis not present

## 2021-02-04 DIAGNOSIS — I951 Orthostatic hypotension: Secondary | ICD-10-CM | POA: Diagnosis not present

## 2021-02-18 DIAGNOSIS — E876 Hypokalemia: Secondary | ICD-10-CM | POA: Diagnosis not present

## 2021-02-18 DIAGNOSIS — N178 Other acute kidney failure: Secondary | ICD-10-CM | POA: Diagnosis not present

## 2021-04-07 DIAGNOSIS — D225 Melanocytic nevi of trunk: Secondary | ICD-10-CM | POA: Diagnosis not present

## 2021-04-07 DIAGNOSIS — Z08 Encounter for follow-up examination after completed treatment for malignant neoplasm: Secondary | ICD-10-CM | POA: Diagnosis not present

## 2021-04-07 DIAGNOSIS — Z85820 Personal history of malignant melanoma of skin: Secondary | ICD-10-CM | POA: Diagnosis not present

## 2021-04-07 DIAGNOSIS — Z1283 Encounter for screening for malignant neoplasm of skin: Secondary | ICD-10-CM | POA: Diagnosis not present

## 2021-04-07 DIAGNOSIS — D487 Neoplasm of uncertain behavior of other specified sites: Secondary | ICD-10-CM | POA: Diagnosis not present

## 2021-04-07 DIAGNOSIS — D485 Neoplasm of uncertain behavior of skin: Secondary | ICD-10-CM | POA: Diagnosis not present

## 2021-05-15 DIAGNOSIS — E876 Hypokalemia: Secondary | ICD-10-CM | POA: Diagnosis not present

## 2021-05-15 DIAGNOSIS — Z79899 Other long term (current) drug therapy: Secondary | ICD-10-CM | POA: Diagnosis not present

## 2021-05-22 DIAGNOSIS — N1832 Chronic kidney disease, stage 3b: Secondary | ICD-10-CM | POA: Diagnosis not present

## 2021-05-22 DIAGNOSIS — E876 Hypokalemia: Secondary | ICD-10-CM | POA: Diagnosis not present

## 2021-06-05 ENCOUNTER — Other Ambulatory Visit (HOSPITAL_COMMUNITY): Payer: Self-pay | Admitting: Internal Medicine

## 2021-06-05 ENCOUNTER — Other Ambulatory Visit: Payer: Self-pay | Admitting: Internal Medicine

## 2021-06-05 DIAGNOSIS — R7989 Other specified abnormal findings of blood chemistry: Secondary | ICD-10-CM

## 2021-06-12 ENCOUNTER — Ambulatory Visit (HOSPITAL_COMMUNITY): Payer: Medicare Other | Attending: Internal Medicine

## 2021-06-12 ENCOUNTER — Encounter (HOSPITAL_COMMUNITY): Payer: Self-pay

## 2021-06-13 DIAGNOSIS — H35372 Puckering of macula, left eye: Secondary | ICD-10-CM | POA: Diagnosis not present

## 2021-06-13 DIAGNOSIS — H524 Presbyopia: Secondary | ICD-10-CM | POA: Diagnosis not present

## 2021-08-20 DIAGNOSIS — Z79899 Other long term (current) drug therapy: Secondary | ICD-10-CM | POA: Diagnosis not present

## 2021-08-20 DIAGNOSIS — N1832 Chronic kidney disease, stage 3b: Secondary | ICD-10-CM | POA: Diagnosis not present

## 2021-08-20 DIAGNOSIS — E876 Hypokalemia: Secondary | ICD-10-CM | POA: Diagnosis not present

## 2021-08-26 DIAGNOSIS — E875 Hyperkalemia: Secondary | ICD-10-CM | POA: Diagnosis not present

## 2021-08-26 DIAGNOSIS — N1832 Chronic kidney disease, stage 3b: Secondary | ICD-10-CM | POA: Diagnosis not present

## 2021-09-01 ENCOUNTER — Ambulatory Visit (HOSPITAL_COMMUNITY)
Admission: RE | Admit: 2021-09-01 | Discharge: 2021-09-01 | Disposition: A | Discharge status: Home / Self Care | Payer: Medicare Other | Source: Ambulatory Visit | Attending: Internal Medicine | Admitting: Internal Medicine

## 2021-09-01 ENCOUNTER — Other Ambulatory Visit: Payer: Self-pay

## 2021-09-01 DIAGNOSIS — N281 Cyst of kidney, acquired: Secondary | ICD-10-CM | POA: Diagnosis not present

## 2021-09-01 DIAGNOSIS — R7989 Other specified abnormal findings of blood chemistry: Secondary | ICD-10-CM | POA: Diagnosis not present

## 2021-09-01 DIAGNOSIS — R944 Abnormal results of kidney function studies: Secondary | ICD-10-CM | POA: Diagnosis not present

## 2022-01-11 DIAGNOSIS — E876 Hypokalemia: Secondary | ICD-10-CM | POA: Diagnosis not present

## 2022-01-11 DIAGNOSIS — I10 Essential (primary) hypertension: Secondary | ICD-10-CM | POA: Diagnosis not present

## 2022-01-22 DIAGNOSIS — R82998 Other abnormal findings in urine: Secondary | ICD-10-CM | POA: Diagnosis not present

## 2022-01-22 DIAGNOSIS — R935 Abnormal findings on diagnostic imaging of other abdominal regions, including retroperitoneum: Secondary | ICD-10-CM | POA: Diagnosis not present

## 2022-11-12 ENCOUNTER — Emergency Department (HOSPITAL_COMMUNITY): Payer: Medicare Other

## 2022-11-12 ENCOUNTER — Other Ambulatory Visit: Payer: Self-pay

## 2022-11-12 ENCOUNTER — Emergency Department (HOSPITAL_COMMUNITY)
Admission: EM | Admit: 2022-11-12 | Discharge: 2022-11-13 | Disposition: A | Discharge status: Home / Self Care | Payer: Medicare Other | Attending: Emergency Medicine | Admitting: Emergency Medicine

## 2022-11-12 ENCOUNTER — Encounter (HOSPITAL_COMMUNITY): Payer: Self-pay | Admitting: *Deleted

## 2022-11-12 DIAGNOSIS — I10 Essential (primary) hypertension: Secondary | ICD-10-CM

## 2022-11-12 DIAGNOSIS — I129 Hypertensive chronic kidney disease with stage 1 through stage 4 chronic kidney disease, or unspecified chronic kidney disease: Secondary | ICD-10-CM | POA: Diagnosis not present

## 2022-11-12 DIAGNOSIS — Z79899 Other long term (current) drug therapy: Secondary | ICD-10-CM | POA: Diagnosis not present

## 2022-11-12 DIAGNOSIS — N189 Chronic kidney disease, unspecified: Secondary | ICD-10-CM | POA: Insufficient documentation

## 2022-11-12 DIAGNOSIS — R030 Elevated blood-pressure reading, without diagnosis of hypertension: Secondary | ICD-10-CM | POA: Diagnosis present

## 2022-11-12 DIAGNOSIS — F0390 Unspecified dementia without behavioral disturbance: Secondary | ICD-10-CM | POA: Insufficient documentation

## 2022-11-12 DIAGNOSIS — R609 Edema, unspecified: Secondary | ICD-10-CM | POA: Diagnosis not present

## 2022-11-12 DIAGNOSIS — R6889 Other general symptoms and signs: Secondary | ICD-10-CM | POA: Diagnosis not present

## 2022-11-12 DIAGNOSIS — Z743 Need for continuous supervision: Secondary | ICD-10-CM | POA: Diagnosis not present

## 2022-11-12 LAB — CBC WITH DIFFERENTIAL/PLATELET
Abs Immature Granulocytes: 0.04 10*3/uL (ref 0.00–0.07)
Basophils Absolute: 0.1 10*3/uL (ref 0.0–0.1)
Basophils Relative: 1 %
Eosinophils Absolute: 0.1 10*3/uL (ref 0.0–0.5)
Eosinophils Relative: 1 %
HCT: 36.1 % — ABNORMAL LOW (ref 39.0–52.0)
Hemoglobin: 11.9 g/dL — ABNORMAL LOW (ref 13.0–17.0)
Immature Granulocytes: 0 %
Lymphocytes Relative: 11 %
Lymphs Abs: 1.1 10*3/uL (ref 0.7–4.0)
MCH: 30.8 pg (ref 26.0–34.0)
MCHC: 33 g/dL (ref 30.0–36.0)
MCV: 93.5 fL (ref 80.0–100.0)
Monocytes Absolute: 0.6 10*3/uL (ref 0.1–1.0)
Monocytes Relative: 6 %
Neutro Abs: 8.5 10*3/uL — ABNORMAL HIGH (ref 1.7–7.7)
Neutrophils Relative %: 81 %
Platelets: 165 10*3/uL (ref 150–400)
RBC: 3.86 MIL/uL — ABNORMAL LOW (ref 4.22–5.81)
RDW: 14.2 % (ref 11.5–15.5)
WBC: 10.3 10*3/uL (ref 4.0–10.5)
nRBC: 0 % (ref 0.0–0.2)

## 2022-11-12 LAB — COMPREHENSIVE METABOLIC PANEL
ALT: 15 U/L (ref 0–44)
AST: 27 U/L (ref 15–41)
Albumin: 3.6 g/dL (ref 3.5–5.0)
Alkaline Phosphatase: 82 U/L (ref 38–126)
Anion gap: 10 (ref 5–15)
BUN: 24 mg/dL — ABNORMAL HIGH (ref 8–23)
CO2: 28 mmol/L (ref 22–32)
Calcium: 8.3 mg/dL — ABNORMAL LOW (ref 8.9–10.3)
Chloride: 101 mmol/L (ref 98–111)
Creatinine, Ser: 2.06 mg/dL — ABNORMAL HIGH (ref 0.61–1.24)
GFR, Estimated: 31 mL/min — ABNORMAL LOW (ref >60)
Glucose, Bld: 119 mg/dL — ABNORMAL HIGH (ref 70–99)
Potassium: 3.5 mmol/L (ref 3.5–5.1)
Sodium: 139 mmol/L (ref 135–145)
Total Bilirubin: 0.6 mg/dL (ref 0.3–1.2)
Total Protein: 6.6 g/dL (ref 6.5–8.1)

## 2022-11-12 LAB — URINALYSIS, ROUTINE W REFLEX MICROSCOPIC
Bilirubin Urine: NEGATIVE
Glucose, UA: 50 mg/dL — AB
Hgb urine dipstick: NEGATIVE
Ketones, ur: NEGATIVE mg/dL
Leukocytes,Ua: NEGATIVE
Nitrite: NEGATIVE
Protein, ur: NEGATIVE mg/dL
Specific Gravity, Urine: 1.009 (ref 1.005–1.030)
pH: 7 (ref 5.0–8.0)

## 2022-11-12 LAB — PHOSPHORUS: Phosphorus: 2.9 mg/dL (ref 2.5–4.6)

## 2022-11-12 LAB — MAGNESIUM: Magnesium: 1.7 mg/dL (ref 1.7–2.4)

## 2022-11-12 MED ORDER — AMLODIPINE BESYLATE 5 MG PO TABS
10.0000 mg | ORAL_TABLET | Freq: Once | ORAL | Status: AC
  Administered 2022-11-12: 10 mg via ORAL
  Filled 2022-11-12: qty 2

## 2022-11-12 MED ORDER — LOSARTAN POTASSIUM 25 MG PO TABS
100.0000 mg | ORAL_TABLET | Freq: Once | ORAL | Status: AC
  Administered 2022-11-12: 100 mg via ORAL
  Filled 2022-11-12: qty 4

## 2022-11-12 NOTE — ED Provider Notes (Signed)
  Provider Note MRN:  [id number]  Arrival date & time: 11/13/22    ED Course and Medical Decision Making  Assumed care from Dr. Maylon Peppers at shift change.  History of Lewy body dementia, noncompliant with medications for the past year, having some behavioral disturbance recently.  Overall plan is for discharge with close follow-up, hoping to control the blood pressure a bit better prior to DC.  12:15 AM update: Blood pressure responding well to oral medications.  Laboratory studies reveal a slight elevation in creatinine compared to a few years ago, presumed to be chronic, doubt this is representative of endorgan damage.  Suspect patient has been severely hypertensive for quite a while, advised PCP follow-up.  Procedures  Final Clinical Impressions(s) / ED Diagnoses     ICD-10-CM   1. Hypertension, unspecified type  I10       ED Discharge Orders     None         Discharge Instructions      You were evaluated in the Emergency Department and after careful evaluation, we did not find any emergent condition requiring admission or further testing in the hospital.  Your exam/testing today was overall reassuring.  Important to restart your blood pressure medicines and follow-up closely with your primary care doctor.  Please return to the Emergency Department if you experience any worsening of your condition.  Thank you for allowing [place] to be a part of your care.       Barth Kirks. Sedonia Small, [place] [email or web address]    Maudie Flakes, MD 11/13/22 [id number]

## 2022-11-12 NOTE — ED Triage Notes (Signed)
Pt in from home via [facility], per report the pt has been combative with his sons and has dementia, but typically is not combative, the pt has bil ankle edema that is new x 1 wk, pt has a L forearm skin tear upon arrival with bleeding controlled with guaze, the pt is calm & cooperative at this time, [facility]

## 2022-11-12 NOTE — ED Provider Notes (Signed)
[facility] EMERGENCY DEPARTMENT Provider Note   CSN: [id number] Arrival date & time: 11/12/22  2147     History  Chief Complaint  Patient presents with   Aggressive Behavior    William Gallagher is a 81 y.o. male.  Patient is an 81 year old male with a past medical history of dementia and hypertension presenting to the emergency department with agitation.  Per EMS, the patient has become increasingly agitated over the last few days which prompted them to bring him to the emergency department.  He was found to be very hypertensive by medics with a blood pressure in the 846N systolic.  He otherwise has been calm and cooperative.  I spoke with son who reports he has a history of Lewy Body dementia and has not been himself since Thanksgiving. He has not been sleeping for day and then will sleep all day. He has started to become combative. He hasn't been taking his blood pressure medicine in over a year. Ankles have had increased swelling in last two weeks.  He denies any trauma or falls.  The patient denies any complaints and denies any pain.  He denies any shortness of breath, fevers, nausea or vomiting.  The history is provided by the patient, the EMS personnel and a relative. History limited by: Dementia.       Home Medications Prior to Admission medications   Medication Sig Start Date End Date Taking? Authorizing Provider  amLODipine (NORVASC) 10 MG tablet Take 10 mg by mouth daily.     [provider]  Cholecalciferol (VITAMIN D3 PO) Take 1,000 Int'l Units by mouth daily.    [provider]  cyanocobalamin 1000 MCG tablet Take 1,000 mcg by mouth daily.    [provider]  donepezil (ARICEPT) 5 MG tablet Take 1 tablet (5 mg total) by mouth at bedtime. 05/24/19   Melvenia Beam, MD  losartan (COZAAR) 100 MG tablet Take 100 mg by mouth daily.     [provider]  Multiple Minerals-Vitamins (CITRACAL MAXIMUM PLUS PO) Take 1 tablet by mouth daily.     [provider]  potassium chloride SA (K-DUR,KLOR-CON) 20 MEQ tablet Take 1 tablet (20 mEq total) by mouth 2 (two) times daily. 01/12/18   Cassandria Anger, MD      Allergies    Other and Tape    Review of Systems   Review of Systems  Physical Exam Updated Vital Signs BP (!) 239/104   Pulse 65   Resp 18   SpO2 100%  Physical Exam Vitals and nursing note reviewed.  Constitutional:      General: He is not in acute distress.    Appearance: Normal appearance.  HENT:     Head: Normocephalic and atraumatic.     Nose: Nose normal.     Mouth/Throat:     Mouth: Mucous membranes are moist.     Pharynx: Oropharynx is clear.  Eyes:     Extraocular Movements: Extraocular movements intact.     Conjunctiva/sclera: Conjunctivae normal.  Cardiovascular:     Rate and Rhythm: Normal rate and regular rhythm.     Pulses: Normal pulses.     Heart sounds: Normal heart sounds.  Pulmonary:     Effort: Pulmonary effort is normal.     Breath sounds: Normal breath sounds.  Abdominal:     General: Abdomen is flat.     Palpations: Abdomen is soft.     Tenderness: There is no abdominal tenderness.  Musculoskeletal:  General: Normal range of motion.     Cervical back: Normal range of motion and neck supple.     Right lower leg: Edema (trace non-pitting to ankles) present.     Left lower leg: Edema (trace non-pitting to ankles) present.  Skin:    General: Skin is warm and dry.  Neurological:     General: No focal deficit present.     Mental Status: He is alert. Mental status is at baseline.     Cranial Nerves: No cranial nerve deficit.     Sensory: No sensory deficit.     Motor: No weakness.     Comments: Oriented to person and place  Psychiatric:        Mood and Affect: Mood normal.        Behavior: Behavior normal.     ED Results / Procedures / Treatments   Labs (all labs ordered are listed, but only abnormal results are displayed) Labs Reviewed  URINALYSIS,  ROUTINE W REFLEX MICROSCOPIC - Abnormal; Notable for the following components:      Result Value   Glucose, UA 50 (*)    All other components within normal limits  COMPREHENSIVE METABOLIC PANEL - Abnormal; Notable for the following components:   Glucose, Bld 119 (*)    BUN 24 (*)    Creatinine, Ser 2.06 (*)    Calcium 8.3 (*)    GFR, Estimated 31 (*)    All other components within normal limits  CBC WITH DIFFERENTIAL/PLATELET - Abnormal; Notable for the following components:   RBC 3.86 (*)    Hemoglobin 11.9 (*)    HCT 36.1 (*)    Neutro Abs 8.5 (*)    All other components within normal limits  MAGNESIUM  PHOSPHORUS    EKG EKG Interpretation  Date/Time:  Thursday November 12 2022 21:59:58 EST Ventricular Rate:  78 PR Interval:    QRS Duration: 103 QT Interval:  418 QTC Calculation: 477 R Axis:   56 Text Interpretation: Sinus rhythm Borderline prolonged QT interval No significant change since last tracing Confirmed by Leanord Asal ([id number]) on 11/12/2022 11:16:22 PM  Radiology DG Chest 1 View  Result Date: 11/12/2022 CLINICAL DATA:  Altered mental status EXAM: CHEST  1 VIEW COMPARISON:  None Available. FINDINGS: Heart size upper limits of normal. No focal consolidation, pleural effusion, or pneumothorax. No acute osseous abnormality. IMPRESSION: No active disease. Electronically Signed   By: Placido Sou M.D.   On: 11/12/2022 22:37    Procedures Procedures    Medications Ordered in ED Medications  losartan (COZAAR) tablet 100 mg (has no administration in time range)  amLODipine (NORVASC) tablet 10 mg (10 mg Oral Given 11/12/22 2230)    ED Course/ Medical Decision Making/ A&P Clinical Course as of 11/12/22 2325  Thu Nov 12, 2022  2324 Patient signed out to Dr. Sedonia Small pending BP control and reassessment [VK]    Clinical Course User Index [VK] Kemper Durie, DO                           Medical Decision Making This patient presents to the ED with chief  complaint(s) of agitation with pertinent past medical history of Lewy body dementia, HTN, CKD which further complicates the presenting complaint. The complaint involves an extensive differential diagnosis and also carries with it a high risk of complications and morbidity.    The differential diagnosis includes no history of trauma and no neuro deficits making  ICH/mass effect unlikely, considering infection, electrolyte abnormality, hypertensive emergency or urgency, dehydration  Additional history obtained: Additional history obtained from EMS, family Records reviewed Care Everywhere/External Records  ED Course and Reassessment: Upon patient's arrival to the emergency department he is hypertensive but calm and cooperative.  He does have nonpitting edema to the ankles, no evidence of JVD or crackles making CHF less likely however chest x-ray will be performed to evaluate for cardiomegaly or pulmonary edema.  He will have labs performed to evaluate for electrolyte abnormality, infection or endorgan damage.  He will be closely reassessed.  Independent labs interpretation:  The following labs were independently interpreted: Creatinine elevated to 2 from her prior labs of 1.4 3 years ago, does have a documented history of CKD and unclear what his baseline is, otherwise within normal range  Independent visualization of imaging: - I independently visualized the following imaging with scope of interpretation limited to determining acute life threatening conditions related to emergency care: CXR, which revealed no acute disease  Consultation: - Consulted or discussed management/test interpretation w/ external professional: N/A      Amount and/or Complexity of Data Reviewed Labs: ordered. Radiology: ordered.  Risk Prescription drug management.          Final Clinical Impression(s) / ED Diagnoses Final diagnoses:  Hypertension, unspecified type    Rx / DC Orders ED Discharge Orders      None         Kemper Durie, DO 11/12/22 2325

## 2022-11-13 NOTE — ED Notes (Signed)
Pt assisted to BR.

## 2022-11-13 NOTE — ED Notes (Signed)
Call placed to son, Remo Lipps, who states he will pick pt up for transportation home, ETA 30 mins

## 2022-11-13 NOTE — Discharge Instructions (Signed)
You were evaluated in the Emergency Department and after careful evaluation, we did not find any emergent condition requiring admission or further testing in the hospital.  Your exam/testing today was overall reassuring.  Important to restart your blood pressure medicines and follow-up closely with your primary care doctor.  Please return to the Emergency Department if you experience any worsening of your condition.  Thank you for allowing [place] to be a part of your care.

## 2022-11-16 DIAGNOSIS — M1990 Unspecified osteoarthritis, unspecified site: Secondary | ICD-10-CM | POA: Diagnosis not present

## 2023-01-21 DIAGNOSIS — I951 Orthostatic hypotension: Secondary | ICD-10-CM | POA: Diagnosis not present

## 2023-04-22 DIAGNOSIS — I951 Orthostatic hypotension: Secondary | ICD-10-CM | POA: Diagnosis not present

## 2023-11-18 DIAGNOSIS — I951 Orthostatic hypotension: Secondary | ICD-10-CM | POA: Diagnosis not present

## 2023-11-18 DIAGNOSIS — Z79899 Other long term (current) drug therapy: Secondary | ICD-10-CM | POA: Diagnosis not present

## 2024-02-01 DIAGNOSIS — N1831 Chronic kidney disease, stage 3a: Secondary | ICD-10-CM | POA: Diagnosis not present

## 2024-02-01 DIAGNOSIS — E876 Hypokalemia: Secondary | ICD-10-CM | POA: Diagnosis not present

## 2024-04-21 DIAGNOSIS — Z961 Presence of intraocular lens: Secondary | ICD-10-CM | POA: Diagnosis not present

## 2024-04-27 DIAGNOSIS — N1831 Chronic kidney disease, stage 3a: Secondary | ICD-10-CM | POA: Diagnosis not present

## 2024-04-27 DIAGNOSIS — E876 Hypokalemia: Secondary | ICD-10-CM | POA: Diagnosis not present

## 2024-05-04 DIAGNOSIS — E876 Hypokalemia: Secondary | ICD-10-CM | POA: Diagnosis not present

## 2024-08-10 DIAGNOSIS — Z79899 Other long term (current) drug therapy: Secondary | ICD-10-CM | POA: Diagnosis not present

## 2024-08-17 DIAGNOSIS — I10 Essential (primary) hypertension: Secondary | ICD-10-CM | POA: Diagnosis not present

## 2024-08-17 DIAGNOSIS — R079 Chest pain, unspecified: Secondary | ICD-10-CM | POA: Diagnosis not present

## 2024-08-18 DIAGNOSIS — I10 Essential (primary) hypertension: Secondary | ICD-10-CM | POA: Diagnosis not present

## 2024-08-28 DIAGNOSIS — I10 Essential (primary) hypertension: Secondary | ICD-10-CM | POA: Diagnosis not present

## 2024-08-28 DIAGNOSIS — S064X1A Epidural hemorrhage with loss of consciousness of 30 minutes or less, initial encounter: Secondary | ICD-10-CM | POA: Diagnosis not present

## 2024-09-18 DIAGNOSIS — I10 Essential (primary) hypertension: Secondary | ICD-10-CM | POA: Diagnosis not present

## 2024-09-18 DIAGNOSIS — D171 Benign lipomatous neoplasm of skin and subcutaneous tissue of trunk: Secondary | ICD-10-CM | POA: Diagnosis not present
